# Patient Record
Sex: Female | Born: 1960 | Race: White | Hispanic: No | Marital: Married | Smoking: Never smoker
Health system: Southern US, Community
[De-identification: ages and names within clinical notes are randomized; demographics above are authoritative.]

## PROBLEM LIST (undated history)

## (undated) DIAGNOSIS — Z9289 Personal history of other medical treatment: Secondary | ICD-10-CM

## (undated) DIAGNOSIS — I10 Essential (primary) hypertension: Secondary | ICD-10-CM

## (undated) DIAGNOSIS — E119 Type 2 diabetes mellitus without complications: Secondary | ICD-10-CM

## (undated) DIAGNOSIS — E041 Nontoxic single thyroid nodule: Secondary | ICD-10-CM

## (undated) DIAGNOSIS — Z860101 Personal history of adenomatous and serrated colon polyps: Secondary | ICD-10-CM

## (undated) DIAGNOSIS — Z87442 Personal history of urinary calculi: Secondary | ICD-10-CM

## (undated) DIAGNOSIS — N201 Calculus of ureter: Secondary | ICD-10-CM

## (undated) DIAGNOSIS — F988 Other specified behavioral and emotional disorders with onset usually occurring in childhood and adolescence: Secondary | ICD-10-CM

## (undated) DIAGNOSIS — Z86010 Personal history of colonic polyps: Secondary | ICD-10-CM

## (undated) DIAGNOSIS — E785 Hyperlipidemia, unspecified: Secondary | ICD-10-CM

## (undated) DIAGNOSIS — M1990 Unspecified osteoarthritis, unspecified site: Secondary | ICD-10-CM

## (undated) DIAGNOSIS — Z973 Presence of spectacles and contact lenses: Secondary | ICD-10-CM

## (undated) DIAGNOSIS — N951 Menopausal and female climacteric states: Secondary | ICD-10-CM

## (undated) HISTORY — DX: Menopausal and female climacteric states: N95.1

## (undated) HISTORY — DX: Personal history of other medical treatment: Z92.89

## (undated) HISTORY — DX: Hyperlipidemia, unspecified: E78.5

## (undated) HISTORY — DX: Other specified behavioral and emotional disorders with onset usually occurring in childhood and adolescence: F98.8

## (undated) HISTORY — DX: Essential (primary) hypertension: I10

## (undated) HISTORY — PX: KNEE SURGERY: SHX244

## (undated) HISTORY — DX: Type 2 diabetes mellitus without complications: E11.9

---

## 2000-10-12 ENCOUNTER — Other Ambulatory Visit: Admission: RE | Admit: 2000-10-12 | Discharge: 2000-10-12 | Payer: Self-pay | Admitting: Obstetrics and Gynecology

## 2002-05-25 ENCOUNTER — Other Ambulatory Visit: Admission: RE | Admit: 2002-05-25 | Discharge: 2002-05-25 | Payer: Self-pay | Admitting: Obstetrics and Gynecology

## 2003-10-02 ENCOUNTER — Other Ambulatory Visit: Admission: RE | Admit: 2003-10-02 | Discharge: 2003-10-02 | Payer: Self-pay | Admitting: Obstetrics and Gynecology

## 2005-09-22 ENCOUNTER — Other Ambulatory Visit: Admission: RE | Admit: 2005-09-22 | Discharge: 2005-09-22 | Payer: Self-pay | Admitting: Obstetrics and Gynecology

## 2007-05-09 ENCOUNTER — Ambulatory Visit: Payer: Self-pay | Admitting: Cardiology

## 2010-04-11 ENCOUNTER — Ambulatory Visit (HOSPITAL_COMMUNITY): Admission: RE | Admit: 2010-04-11 | Discharge: 2010-04-11 | Payer: Self-pay | Admitting: Obstetrics and Gynecology

## 2010-04-11 HISTORY — PX: BLADDER SUSPENSION: SHX72

## 2010-12-06 LAB — SURGICAL PCR SCREEN
MRSA, PCR: NEGATIVE
Staphylococcus aureus: NEGATIVE

## 2010-12-06 LAB — CBC
Platelets: 310 10*3/uL (ref 150–400)
RBC: 4.36 MIL/uL (ref 3.87–5.11)
WBC: 5.6 10*3/uL (ref 4.0–10.5)

## 2011-02-03 NOTE — Assessment & Plan Note (Signed)
[facility]                            CARDIOLOGY OFFICE NOTE   NAME:Martin, Teresa C                           MRN:          [id number]  DATE:05/09/2007                            DOB:          04/15/1961    Teresa Martin is a delightful 50 year old married white female who is  referred by Dr. Richardean Chimera to consult on her family history of  premature coronary disease.  She also has a risk factor of obesity.   HISTORY OF PRESENT ILLNESS:  She is 50 years of age, married mother of 3  who is a high school Retail buyer.   Her father, who was hypertensive, and also a little bit over weight, and  sporadically exercised, died of a massive heart attack within a week of  playing tennis.  He was 22 years of age.  Her grandfather, her father's  father, died at 39 of a heart attack.   She has no conventional risk factors other than her weight, and somewhat  sedentary lifestyle.  She does walk her dog daily at a slow pace about  30 minutes a day.   Specifically, she does not smoke, does not have hypertension or  diabetes, does not use any illicit drugs, and her lipids are good.  I do  not have a copy of this, but she says that they were recently checked,  and were said to be good by Dr. Arelia Sneddon.   She is currently having no symptoms of ischemia or angina.   PAST MEDICAL HISTORY:  She is on:  1. Concerta 54 mg a day.  2. Lutine for her eyes.  3. Calcium.  4. Multivitamin.   SURGICAL HISTORY:  None.   FAMILY HISTORY:  As above.   SOCIAL HISTORY:  As mentioned above, she is a high school Advertising copywriter.  She is married, and has 3 children.   REVIEW OF SYSTEMS:  Negative, other than the HPI.   EXAMINATION:  Her blood pressure is 124/84.  Pulse 70 and regular.  Weight is 165.  She is 5 feet 6 inches.  HEENT:  Normocephalic and atraumatic.  PERRLA.  Extraocular movements  intact.  Sclerae are clear.  Facial symmetry is normal.  Carotids are full without  bruits.  There is no JVD.  Thyroid is not  enlarged.  Trachea is midline.  LUNGS:  Clear.  HEART:  Reveals a non-displaced PMI.  She has a normal S1 and S2 without  murmur or gallop.  ABDOMINAL EXAM:  Soft with good bowel sounds.  No midline bruit.  EXTREMITIES:  Reveal no cyanosis, clubbing, or edema.  Pulses are  intact.  NEUROLOGIC:  Exam is intact.  SKIN:  Unremarkable.   EKG:  Completely normal.   ASSESSMENT AND PLAN:  Ms. Cranmer has very low 10-year risk for coronary  artery disease or premature coronary event.  Her Framingham risk score  calculates in the 1% to 2% range.   I had a long talk with her today about therapeutic lifestyle changes  including brisk walking 3 hours per week, as  well as maintaining her  weight, or actually losing down to about 150 pounds.  This would put her  BMI in to the healthy zone.   I would not recommend any other measurements at the present time.  One  of her biggest risks is developing type 2 diabetes, as her brother has,  with increasing weight.  I made her well aware of the consequences of  this, and the above necessary measurements to avoid it.   We will see her back on a p.r.n. basis.  She was a delight to meet and  talk to.     Thomas C. Daleen Squibb, MD, [facility]  Electronically Signed    TCW/MedQ  DD: 05/09/2007  DT: 05/10/2007  Job #: [id number]   cc:   Juluis Mire, M.D.

## 2012-04-19 ENCOUNTER — Encounter: Payer: Self-pay | Admitting: *Deleted

## 2012-04-26 ENCOUNTER — Encounter: Payer: Self-pay | Admitting: Cardiology

## 2012-04-26 ENCOUNTER — Ambulatory Visit (INDEPENDENT_AMBULATORY_CARE_PROVIDER_SITE_OTHER): Payer: No Typology Code available for payment source | Admitting: Cardiology

## 2012-04-26 VITALS — BP 145/94 | HR 91 | Ht 60.0 in | Wt 189.6 lb

## 2012-04-26 DIAGNOSIS — Z9189 Other specified personal risk factors, not elsewhere classified: Secondary | ICD-10-CM

## 2012-04-26 DIAGNOSIS — E663 Overweight: Secondary | ICD-10-CM

## 2012-04-26 DIAGNOSIS — E7800 Pure hypercholesterolemia, unspecified: Secondary | ICD-10-CM

## 2012-04-26 DIAGNOSIS — I10 Essential (primary) hypertension: Secondary | ICD-10-CM

## 2012-04-26 DIAGNOSIS — Z789 Other specified health status: Secondary | ICD-10-CM

## 2012-04-26 MED ORDER — LISINOPRIL 10 MG PO TABS: 10.0000 mg | ORAL_TABLET | Freq: Every day | ORAL | Status: DC

## 2012-04-26 NOTE — Patient Instructions (Addendum)
Start lisinopril 10mg  daily.  Start aspirin 81mg  daily. Take this with food.  Your physician recommends that you return for fasting lab in about 10-14 days--BMET/Lp a.   Take and record your blood pressure daily. I will call you in about 3 weeks to get the readings. Luana Shu [phone].  Schedule an appointment for a calcium artery score CT scan.  Your physician wants you to follow-up in: 1 year with Dr Shirlee Latch. (August 2014). You will receive a reminder letter in the mail two months in advance. If you don't receive a letter, please call our office to schedule the follow-up appointment.    Your physician recommends that you return for a FASTING lipid profile: in 1 year when you see Dr Shirlee Latch.

## 2012-04-27 DIAGNOSIS — Z9189 Other specified personal risk factors, not elsewhere classified: Secondary | ICD-10-CM | POA: Insufficient documentation

## 2012-04-27 DIAGNOSIS — E663 Overweight: Secondary | ICD-10-CM | POA: Insufficient documentation

## 2012-04-27 DIAGNOSIS — I10 Essential (primary) hypertension: Secondary | ICD-10-CM | POA: Insufficient documentation

## 2012-04-27 NOTE — Assessment & Plan Note (Signed)
We discussed diet and exercise strategies for weight loss.

## 2012-04-27 NOTE — Assessment & Plan Note (Addendum)
Blood pressure has been consistently elevated.  I will have her start on lisinopril 10 mg daily with BMET in 2 wks to follow K and creatinine. She will get a BP cuff and check her BP on lisinopril.  We will call in 3 wks to see what her BP is running.  She does not screen positive for OSA.  Weight loss would certainly be helpful for BP control.

## 2012-04-27 NOTE — Assessment & Plan Note (Signed)
The Framingham risk score here is not particularly high (2% risk of cardiovascular event over the next 2 years).  However, this does not take into account family history.  Teresa Martin has a rather strong family history of coronary disease, both her father and grandfather died of MIs in their 57s.  I think that we should treat her HTN, as above, with lisinopril.  I think that it would be reasonable, given the family history, to have her take ASA 81 mg daily.  We also need to consider treatment of her cholesterol, given LDL 151.  We talked at length about this.  I will further risk stratify her with a lipoprotein(a) and with a coronary artery calcium score.  If either are abnormal, I think she should take a statin.  If both are normal, a period of lifestyle modification would be appropriate.

## 2012-04-27 NOTE — Progress Notes (Signed)
Patient ID: Teresa Martin, female   DOB: [birth year], 51 y.o.   MRN: [id number] Referring MD: Dr. Arelia Sneddon  51 yo with history of HTN and hyperlipidemia presents for evaluation of cardiovascular risk factors.  She is concerned about her risk for coronary disease as her father died of a massive MI at 41 and her grandfather died of a MI at 30.  She does exercise => primarily walking.  No exertional dyspnea, good exercise tolerance. No history of chest pain or pressure.  She has had high blood pressure readings for 2 years.  She is 145/94 today and brings in several recent readings: 160/100, 150/92.  She also has elevated LDL cholesterol, 151 when last checked.  She does not have OSA symptoms (loud snoring, daytime sleepiness, etc).  She does not follow a specific diet though she tries to limit fats.  She does not smoke.    ECG: NSR, normal  Labs (6/13): LDL 151, HDL 55, TSH normal  PMH: 1. Attention deficit disorder 2. HTN 3. Hyperlipidemia  SH: Nonsmoker.  Married, 3 children.  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   FH: Father died of large MI at 35.  Grandfather died of large MI at 31.   ROS: All systems reviewed and negative except as per HPI.   Current Outpatient Prescriptions  Medication Sig Dispense Refill  . methylphenidate (CONCERTA) 27 MG CR tablet Take 27 mg by mouth as directed.       Marland Kitchen aspirin EC 81 MG tablet Take 1 tablet (81 mg total) by mouth daily.      Marland Kitchen lisinopril (PRINIVIL,ZESTRIL) 10 MG tablet Take 1 tablet (10 mg total) by mouth daily.  30 tablet  11    BP 145/94  Pulse 91  Ht 5' (1.524 m)  Wt 189 lb 9.6 oz (86.002 kg)  BMI 37.03 kg/m2 General: NAD, overweight Neck: No JVD, no thyromegaly or thyroid nodule.  Lungs: Clear to auscultation bilaterally with normal respiratory effort. CV: Nondisplaced PMI.  Heart regular S1/S2, no S3/S4, no murmur.  No peripheral edema.  No carotid bruit.  Normal pedal pulses.  Abdomen: Soft, nontender, no  hepatosplenomegaly, no distention.  Skin: Intact without lesions or rashes.  Neurologic: Alert and oriented x 3.  Psych: Normal affect. Extremities: No clubbing or cyanosis.  HEENT: Normal.

## 2012-05-10 ENCOUNTER — Other Ambulatory Visit (INDEPENDENT_AMBULATORY_CARE_PROVIDER_SITE_OTHER): Payer: No Typology Code available for payment source

## 2012-05-10 ENCOUNTER — Ambulatory Visit (INDEPENDENT_AMBULATORY_CARE_PROVIDER_SITE_OTHER)
Admission: RE | Admit: 2012-05-10 | Discharge: 2012-05-10 | Disposition: A | Discharge status: Home / Self Care | Payer: Self-pay | Source: Ambulatory Visit | Attending: Cardiology | Admitting: Cardiology

## 2012-05-10 DIAGNOSIS — I10 Essential (primary) hypertension: Secondary | ICD-10-CM

## 2012-05-10 DIAGNOSIS — E7800 Pure hypercholesterolemia, unspecified: Secondary | ICD-10-CM

## 2012-05-10 DIAGNOSIS — Z9189 Other specified personal risk factors, not elsewhere classified: Secondary | ICD-10-CM

## 2012-05-10 DIAGNOSIS — Z789 Other specified health status: Secondary | ICD-10-CM

## 2012-05-10 LAB — BASIC METABOLIC PANEL
CO2: 27 mEq/L (ref 19–32)
Calcium: 9.1 mg/dL (ref 8.4–10.5)
Chloride: 103 mEq/L (ref 96–112)
Sodium: 136 mEq/L (ref 135–145)

## 2012-05-13 ENCOUNTER — Telehealth: Payer: Self-pay | Admitting: *Deleted

## 2012-05-13 NOTE — Telephone Encounter (Signed)
Hypertension - Marca Ancona, MD 04/27/2012 1:50 PM Addendum  Blood pressure has been consistently elevated. I will have her start on lisinopril 10 mg daily with BMET in 2 wks to follow K and creatinine. She will get a BP cuff and check her BP on lisinopril. We will call in 3 wks to see what her BP is running. She does not screen positive for OSA. Weight loss would certainly be helpful for BP control.  05/13/12--spoke with pt. Pt did not have BP readings with her but states her BP has been better- mostly in the 120/80 range. I will forward to Dr Shirlee Latch for review

## 2012-05-15 NOTE — Telephone Encounter (Signed)
Ok no changes

## 2012-05-16 NOTE — Telephone Encounter (Signed)
LMTCB

## 2012-05-18 NOTE — Telephone Encounter (Signed)
Spoke with pt

## 2013-05-03 ENCOUNTER — Other Ambulatory Visit: Payer: Self-pay | Admitting: Cardiology

## 2013-05-05 ENCOUNTER — Encounter: Payer: Self-pay | Admitting: *Deleted

## 2013-05-09 ENCOUNTER — Encounter: Payer: Self-pay | Admitting: Cardiology

## 2013-05-09 ENCOUNTER — Ambulatory Visit (INDEPENDENT_AMBULATORY_CARE_PROVIDER_SITE_OTHER): Payer: No Typology Code available for payment source | Admitting: Cardiology

## 2013-05-09 VITALS — BP 122/80 | HR 81 | Ht 60.0 in | Wt 187.0 lb

## 2013-05-09 DIAGNOSIS — Z9189 Other specified personal risk factors, not elsewhere classified: Secondary | ICD-10-CM

## 2013-05-09 DIAGNOSIS — I10 Essential (primary) hypertension: Secondary | ICD-10-CM

## 2013-05-09 DIAGNOSIS — E785 Hyperlipidemia, unspecified: Secondary | ICD-10-CM

## 2013-05-09 MED ORDER — ATORVASTATIN CALCIUM 10 MG PO TABS: 10.0000 mg | ORAL_TABLET | Freq: Every day | ORAL | Status: DC

## 2013-05-09 MED ORDER — ROSUVASTATIN CALCIUM 5 MG PO TABS: 5.0000 mg | ORAL_TABLET | Freq: Every day | ORAL | Status: DC

## 2013-05-09 MED ORDER — LISINOPRIL 20 MG PO TABS: 20.0000 mg | ORAL_TABLET | Freq: Every day | ORAL | Status: DC

## 2013-05-09 NOTE — Patient Instructions (Addendum)
**Note De-Identified Luis Sami Obfuscation** Your physician has recommended you make the following change in your medication: start taking Atorvastatin 10 mg at bedtime and increase Lisinopril to 20 mg daily  Your physician recommends that you return for lab work in: today and in 2 months (10/13)  Your physician wants you to follow-up in: 1 year. You will receive a reminder letter in the mail two months in advance. If you don't receive a letter, please call our office to schedule the follow-up appointment.

## 2013-05-10 DIAGNOSIS — E785 Hyperlipidemia, unspecified: Secondary | ICD-10-CM | POA: Insufficient documentation

## 2013-05-10 LAB — BASIC METABOLIC PANEL
CO2: 27 mEq/L (ref 19–32)
GFR: 94.81 mL/min (ref >60.00)
Glucose, Bld: 159 mg/dL — ABNORMAL HIGH (ref 70–99)
Potassium: 3.6 mEq/L (ref 3.5–5.1)
Sodium: 136 mEq/L (ref 135–145)

## 2013-05-10 LAB — LIPID PANEL
HDL: 55.6 mg/dL (ref >39.00)
VLDL: 22 mg/dL (ref 0.0–40.0)

## 2013-05-10 NOTE — Progress Notes (Signed)
Patient ID: Teresa Martin, female   DOB: Dec 17, 1960, 52 y.o.   MRN: [id number] Referring MD: Dr. Arelia Sneddon  52 yo with history of HTN and hyperlipidemia returns for evaluation of cardiovascular risk factors.  She is concerned about her risk for coronary disease as her father died of a massive MI at 86 and her grandfather died of a MI at 49.  She does exercise => primarily walking.  No exertional dyspnea, good exercise tolerance. No history of chest pain or pressure.  BP remains high on lisinopril 10 mg daily (typically SBP > 140).  I had suggested starting a statin primarily given family history and elevated Lp(a) but she wanted to hold off.   Labs (6/13): LDL 151, HDL 55, TSH normal Labs (8/13): Lp(a) 70  PMH: 1. Attention deficit disorder 2. HTN 3. Hyperlipidemia 4. Coronary artery calcium score 8/13: 0 Agatston units  SH: Nonsmoker.  Married, 3 children.  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   FH: Father died of large MI at 44.  Grandfather died of large MI at 24.   ROS: All systems reviewed and negative except as per HPI.   Current Outpatient Prescriptions  Medication Sig Dispense Refill  . aspirin EC 81 MG tablet Take 1 tablet (81 mg total) by mouth daily.      Marland Kitchen lisinopril (PRINIVIL,ZESTRIL) 20 MG tablet Take 1 tablet (20 mg total) by mouth daily.  90 tablet  3  . methylphenidate (CONCERTA) 27 MG CR tablet Take 27 mg by mouth as directed.       Marland Kitchen atorvastatin (LIPITOR) 10 MG tablet Take 1 tablet (10 mg total) by mouth daily.  90 tablet  3   No current facility-administered medications for this visit.    BP 122/80  Pulse 81  Ht 5' (1.524 m)  Wt 84.823 kg (187 lb)  BMI 36.52 kg/m2  SpO2 97% General: NAD, overweight Neck: No JVD, no thyromegaly or thyroid nodule.  Lungs: Clear to auscultation bilaterally with normal respiratory effort. CV: Nondisplaced PMI.  Heart regular S1/S2, no S3/S4, no murmur.  No peripheral edema.  No carotid bruit.  Normal pedal pulses.   Abdomen: Soft, nontender, no hepatosplenomegaly, no distention.  Skin: Intact without lesions or rashes.  Neurologic: Alert and oriented x 3.  Psych: Normal affect. Extremities: No clubbing or cyanosis.   Assessment/Plan: 1. HTN: BP remains high at home.  I am going to increase lisinopril to 20 mg daily with followup BMET. Next step will be to add HCTZ.  Continue to exercise and work at weight loss.  Call in 2 wks for BP check.  2. 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  However, coronary artery calcium score was 0.  Given her strong family history of premature CAD, I continue to think that a statin would be reasonable for her.  We discussed it today and she will try Crestor 5 mg daily with lipids/LFTs in 2 months.   Marca Ancona 05/10/2013

## 2013-05-16 ENCOUNTER — Other Ambulatory Visit: Payer: Self-pay | Admitting: *Deleted

## 2013-05-26 ENCOUNTER — Telehealth: Payer: Self-pay | Admitting: *Deleted

## 2013-05-26 NOTE — Telephone Encounter (Signed)
05/09/13 HTN: BP remains high at home. I am going to increase lisinopril to 20 mg daily with followup BMET. Next step will be to add HCTZ. Continue to exercise and work at weight loss. Call in 2 wks for BP check.   05/26/13 Pt states she did not have log of BP readings with her today, she did say BP was better since increasing lisinopril. She will call back the first of the week with the readings.

## 2013-05-30 NOTE — Telephone Encounter (Signed)
LMTCB

## 2013-06-28 ENCOUNTER — Ambulatory Visit: Payer: No Typology Code available for payment source | Admitting: Cardiology

## 2013-06-29 NOTE — Telephone Encounter (Signed)
I have been unable to reach patient by telephone.

## 2014-05-22 ENCOUNTER — Other Ambulatory Visit: Payer: Self-pay | Admitting: *Deleted

## 2014-05-22 MED ORDER — ATORVASTATIN CALCIUM 10 MG PO TABS: 10.0000 mg | ORAL_TABLET | Freq: Every day | ORAL | Status: DC

## 2014-07-19 ENCOUNTER — Other Ambulatory Visit: Payer: Self-pay | Admitting: Cardiology

## 2014-08-29 ENCOUNTER — Other Ambulatory Visit: Payer: Self-pay | Admitting: Cardiology

## 2014-09-05 ENCOUNTER — Ambulatory Visit: Payer: Self-pay | Admitting: Cardiology

## 2014-09-20 ENCOUNTER — Encounter: Payer: Self-pay | Admitting: *Deleted

## 2014-09-20 ENCOUNTER — Encounter: Payer: BC Managed Care – PPO | Attending: Endocrinology | Admitting: *Deleted

## 2014-09-20 VITALS — Ht 65.5 in | Wt 188.1 lb

## 2014-09-20 DIAGNOSIS — E119 Type 2 diabetes mellitus without complications: Secondary | ICD-10-CM

## 2014-09-20 DIAGNOSIS — Z713 Dietary counseling and surveillance: Secondary | ICD-10-CM | POA: Diagnosis not present

## 2014-09-20 NOTE — Progress Notes (Signed)
Diabetes Self-Management Education   Appt. Start Time: 260830t. End Time: 1030  09/20/2014  [name], identified by name and date of birth, is a 53 y.o. female with a diagnosis of Diabetes: Type 2.  Other people present during visit:  Patient . Patient presents with new diagnosis of T2DM. In review of her lab results I note that her glucose has been elevated for greater than years. Patient indicated that she was unaware of the elevation.  ASSESSMENT  Height 5' 5.5" (1.664 m), weight 188 lb 1.6 oz (85.322 kg). Body mass index is 30.81 kg/(m^2).  Initial Visit Information:  Are you currently following a meal plan?: No Are you taking your medications as prescribed?: Yes Are you checking your feet?: No How often do you need to have someone help you when you read instructions, pamphlets, or other written materials from your doctor or pharmacy?: 1 - Never  Psychosocial:   Patient Belief/Attitude about Diabetes: Motivated to manage diabetes Self-care barriers: None Self-management support: Doctor's office, Family, CDE visits Other persons present: Patient Patient Concerns: Nutrition/Meal planning, Medication, Monitoring, Glycemic Control Special Needs: None Preferred Learning Style: No preference indicated Learning Readiness: Ready  Complications:   How often do you check your blood sugar?: 0 times/day (not testing) (will begin testing FBS & 2hpp dinner) Have you had a dilated eye exam in the past 12 months?: No Have you had a dental exam in the past 12 months?: No  Exercise:  Exercise: Moderate (swimming / aerobic walking) Moderate Exercise amount of time (min / week): 150  Individualized Plan for Diabetes Self-Management Training:   Learning Objective:  Patient will have a greater understanding of diabetes self-management. Patient education plan per assessed needs and concerns is to attend individual sessions      Education Topics Reviewed with Patient Today:  Factors  that contribute to the development of diabetes, Explored patient's options for treatment of their diabetes Role of diet in the treatment of diabetes and the relationship between the three main macronutrients and blood glucose level, Food label reading, portion sizes and measuring food., Carbohydrate counting, Reviewed blood glucose goals for pre and post meals and how to evaluate the patients' food intake on their blood glucose level., Effects of alcohol on blood glucose and safety factors with consumption of alcohol., Meal options for control of blood glucose level and chronic complications. Role of exercise on diabetes management, blood pressure control and cardiac health. Taught/evaluated SMBG meter., Purpose and frequency of SMBG., Daily foot exams, Yearly dilated eye exam, Identified appropriate SMBG and/or A1C goals. Relationship between chronic complications and blood glucose control, Assessed and discussed foot care and prevention of foot problems, Lipid levels, blood glucose control and heart disease, Identified and discussed with patient  current chronic complications, Nephropathy, what it is, prevention of, the use of ACE, ARB's and early detection of through urine microalbumia., Reviewed with patient heart disease, higher risk of, and prevention   PATIENTS GOALS/Plan (Developed by the patient):  Nutrition: General guidelines for healthy choices and portions discussed Physical Activity: Exercise 3-5 times per week Medications: take my medication as prescribed Monitoring : test blood glucose pre and post meals as discussed Reducing Risk: get labs drawn, do foot checks daily  Patient Instructions  Plan:  Aim for 3 Carb Choices per meal (45 grams) +/- 1 either way  Aim for 0-15 Carbs per snack if hungry  Include protein in moderation with your meals and snacks Consider reading food labels for Total Carbohydrate and Fat  Grams of foods Continue  your activity level daily as  tolerated Consider checking BG at alternate times per day as directed by MD  Consider taking medication as directed by MD  Glucose targets:  Fasting: 70-130          2 hours after meal <180  Today result 2 1/2hours pp 233mg /dl  Expected Outcomes:  Demonstrated interest in learning. Expect positive outcomes  Education material provided: Living Well with Diabetes, A1C conversion sheet, Meal plan card, My Plate and Snack sheet  If problems or questions, patient to contact team via:  Phone  Future DSME appointment: 4-6 wks

## 2014-09-20 NOTE — Patient Instructions (Signed)
Plan:  Aim for 3 Carb Choices per meal (45 grams) +/- 1 either way  Aim for 0-15 Carbs per snack if hungry  Include protein in moderation with your meals and snacks Consider reading food labels for Total Carbohydrate and Fat Grams of foods Continue your activity level daily as tolerated Consider checking BG at alternate times per day to include FBS and 2hpp largest meal of the day as directed by MD  Consider taking medication as directed by MD  Fasting: 70-130 2 hours after meal <180  Today result 2 1/2hours pp 233mg /dl  Follow dietary recommendations, test glucose BID, Call Dr. Talmage NapBalan upon return from your trip and report glucose readings.

## 2014-10-03 ENCOUNTER — Other Ambulatory Visit: Payer: Self-pay | Admitting: Cardiology

## 2014-10-04 ENCOUNTER — Encounter: Payer: Self-pay | Admitting: Cardiology

## 2014-10-04 ENCOUNTER — Ambulatory Visit (INDEPENDENT_AMBULATORY_CARE_PROVIDER_SITE_OTHER): Payer: Commercial Managed Care - PPO | Admitting: Cardiology

## 2014-10-04 VITALS — BP 140/82 | HR 63 | Ht 65.5 in | Wt 199.4 lb

## 2014-10-04 DIAGNOSIS — E785 Hyperlipidemia, unspecified: Secondary | ICD-10-CM

## 2014-10-04 DIAGNOSIS — I10 Essential (primary) hypertension: Secondary | ICD-10-CM

## 2014-10-04 MED ORDER — LISINOPRIL 20 MG PO TABS: 20.0000 mg | ORAL_TABLET | Freq: Every day | ORAL | Status: DC

## 2014-10-04 MED ORDER — HYDROCHLOROTHIAZIDE 25 MG PO TABS: 25.0000 mg | ORAL_TABLET | Freq: Every day | ORAL | Status: DC

## 2014-10-04 MED ORDER — POTASSIUM CHLORIDE ER 10 MEQ PO TBCR: 10.0000 meq | EXTENDED_RELEASE_TABLET | Freq: Every day | ORAL | Status: DC

## 2014-10-04 MED ORDER — ATORVASTATIN CALCIUM 10 MG PO TABS: 10.0000 mg | ORAL_TABLET | Freq: Every day | ORAL | Status: DC

## 2014-10-04 NOTE — Patient Instructions (Signed)
Start HCTZ (hydrochlorothiazide) 25mg  daily.  Start KCL (potassium) 10 mEq daily.  Your physician recommends that you return for lab work in: about 10 days--BMET.  Your physician has requested that you regularly monitor and record your blood pressure readings at home. Please use the same machine at the same time of day to check your readings and record them to bring. I will call you in 2-3 weeks to get the readings.   Your physician wants you to follow-up in: 6 monhts with Dr Shirlee LatchMcLean. (July 2016). You will receive a reminder letter in the mail two months in advance. If you don't receive a letter, please call our office to schedule the follow-up appointment.

## 2014-10-04 NOTE — Progress Notes (Signed)
Patient ID: Teresa Martin, female   DOB: Apr 12, 1961, 54 y.o.   MRN: [id number] Referring MD: Dr. Arelia Sneddon  54 yo with history of HTN, hyperlipidemia, and newly diagnosed type II diabetes returns for evaluation of cardiovascular risk factors.  She is concerned about her risk for coronary disease as her father died of a massive MI at 8 and her grandfather died of a MI at 63.  She does exercise => primarily walking.  No exertional dyspnea, good exercise tolerance. No history of chest pain or pressure.  At last visit, I increased her lisinopril but BP remains elevated today and on home readings.  Recent lipids show good LDL and HDL.  She was diagnosed with diabetes recently, blood glucose is running quite high.  She will be seeing Dr. Talmage Nap to get on meds.  She snores mildly at night but denies daytime sleepiness.   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 Attention deficit disorder 2. HTN 3. Hyperlipidemia 4. Coronary artery calcium score 8/13: 0 Agatston units 5. Type II diabetes  SH: Nonsmoker.  Married, 3 children.  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   FH: Father died of large MI at 62.  Grandfather died of large MI at 41.   ROS: All systems reviewed and negative except as per HPI.   Current Outpatient Prescriptions  Medication Sig Dispense Refill  . aspirin EC 81 MG tablet Take 1 tablet (81 mg total) by mouth daily.    Marland Kitchen atorvastatin (LIPITOR) 10 MG tablet Take 1 tablet (10 mg total) by mouth daily. 90 tablet 1  . lisinopril (PRINIVIL,ZESTRIL) 20 MG tablet Take 1 tablet (20 mg total) by mouth daily. 90 tablet 1  . methylphenidate (CONCERTA) 27 MG CR tablet Take 27 mg by mouth daily as needed. By mouth daily as needed for concentration    . ONETOUCH DELICA LANCETS 33G MISC   0  . ONETOUCH VERIO test strip    0  . hydrochlorothiazide (HYDRODIURIL) 25 MG tablet Take 1 tablet (25 mg total) by mouth daily. 90 tablet 1  . potassium chloride (K-DUR) 10 MEQ tablet Take 1 tablet (10 mEq total) by mouth daily. 90 tablet 1   No current facility-administered medications for this visit.    BP 140/82 mmHg  Pulse 63  Ht 5' 5.5" (1.664 m)  Wt 199 lb 6.4 oz (90.447 kg)  BMI 32.67 kg/m2 General: NAD, overweight Neck: No JVD, no thyromegaly or thyroid nodule.  Lungs: Clear to auscultation bilaterally with normal respiratory effort. CV: Nondisplaced PMI.  Heart regular S1/S2, no S3/S4, no murmur.  No peripheral edema.  No carotid bruit.  Normal pedal pulses.  Abdomen: Soft, nontender, no hepatosplenomegaly, no distention.  Skin: Intact without lesions or rashes.  Neurologic: Alert and oriented x 3.  Psych: Normal affect. Extremities: No clubbing or cyanosis.   Assessment/Plan: 1. HTN: BP remains high at home. Continue lisinopril, I will add HCTZ 25 mg daily with KCl 10 daily.  She will need BMET in 2 wks.  We will call her for BP readings in about 2 wks.  Weight loss would help BP control.  She does not screen positive for OSA.  2. Hyperlipidemia: LDL much improved on atorvastatin.  Continue current statin dose.  3. Diabetes: No meds currently but blood  glucose has been very high on her home readings.  She will need medication and will call her endocrinologist, Dr. Talmage NapBalan.   Marca AnconaDalton Melford Tullier 10/04/2014

## 2014-10-05 NOTE — Addendum Note (Signed)
Addended by: Jacqlyn KraussLANKFORD, Orlen Leedy M on: 10/05/2014 07:25 AM   Modules accepted: Orders

## 2014-10-18 ENCOUNTER — Ambulatory Visit: Payer: BC Managed Care – PPO | Admitting: *Deleted

## 2014-10-18 ENCOUNTER — Other Ambulatory Visit: Payer: Commercial Managed Care - PPO

## 2014-10-18 ENCOUNTER — Encounter: Payer: Self-pay | Admitting: Cardiology

## 2014-10-19 ENCOUNTER — Ambulatory Visit: Payer: BC Managed Care – PPO | Admitting: *Deleted

## 2014-10-22 ENCOUNTER — Other Ambulatory Visit: Payer: Commercial Managed Care - PPO

## 2014-10-23 ENCOUNTER — Other Ambulatory Visit: Payer: Commercial Managed Care - PPO

## 2014-10-31 ENCOUNTER — Encounter: Payer: Commercial Managed Care - PPO | Attending: Endocrinology | Admitting: *Deleted

## 2014-10-31 VITALS — Wt 182.1 lb

## 2014-10-31 DIAGNOSIS — Z713 Dietary counseling and surveillance: Secondary | ICD-10-CM | POA: Insufficient documentation

## 2014-10-31 DIAGNOSIS — E119 Type 2 diabetes mellitus without complications: Secondary | ICD-10-CM | POA: Insufficient documentation

## 2014-11-01 ENCOUNTER — Encounter: Payer: Self-pay | Admitting: *Deleted

## 2014-11-05 NOTE — Progress Notes (Signed)
Diabetes Self-Management Education  Visit Type:   Follow-up  Appt. Start Time: 0800 Appt. End Time: 0930  11/05/2014  [name], identified by name and date of birth, is a 54 y.o. female with a diagnosis of Diabetes: Type 2.  Other people present during visit:  Patient . Patient had many questions related the nutrition. I had Teresa BraunLeslie Martin, RD, LCN come join in our session to address specific questions. I suggested that [name] return for follow up with Teresa Martin for further nutrition consultation related to her diabetes.  ASSESSMENT  Weight 182 lb 1.6 oz (82.6 kg). Body mass index is 29.83 kg/(m^2).   Subsequent Visit Information:  Since your last visit, have you continued or began the use of a meal plan?: Yes How many days a week are you following a meal plan?: 7 Since your last visit, have you continued or began to exercise on a consistent basis?: Yes How many days per week are you exercising or participating in a physicial activity for more than 20 minutes?: 6 Since your last visit have you continued or begun to take your medications as prescribed?: Yes (just started Metformin XR 500mg ) Since your last visit have you experienced any weight changes?: Loss Weight Loss (lbs): 17 Since your last visit, are you checking your blood glucose at least once a day?: Yes  Psychosocial:   Patient Belief/Attitude about Diabetes: Motivated to manage diabetes Self-care barriers: None Self-management support: Doctor's office, CDE visits Other persons present: Patient Patient Concerns: Nutrition/Meal planning, Glycemic Control, Weight Control Special Needs: None Preferred Learning Style: No preference indicated Learning Readiness: Change in progress  Complications:   Last HgB A1C per patient/outside source: 10 mg/dL (increase from 1.6%9.4% on 09/11/2014) How often do you check your blood sugar?: 1-2 times/day  Exercise:  Exercise: Moderate (swimming / aerobic walking) Moderate Exercise  amount of time (min / week): 135  Individualized Plan for Diabetes Self-Management Training:   Learning Objective:  Patient will have a greater understanding of diabetes self-management. Patient education plan per assessed needs and concerns is to attend individual sessions    Education Topics Reviewed with Patient Today:   Information on hints to eating out and maintain blood glucose control. Reviewed patients medication for diabetes, action, purpose, timing of dose and side effects.   PATIENTS GOALS/Plan (Developed by the patient):  Physical Activity: Exercise 5-7 days per week Medications: take my medication as prescribed  Patient Self Evaluation of Goals - Patient rates self as meeting previously set goals:   Nutrition: >75% Physical Activity: >75% Medications: >75% Monitoring: >75% Problem Solving: >75% Reducing Risk: >75% Health Coping: >75%  Plan:   There are no Patient Instructions on file for this visit.   Expected Outcomes:  Demonstrated interest in learning. Expect positive outcomes  If problems or questions, patient to contact team via:  Phone  Future DSME appointment: - 2 months with Dietitian

## 2015-03-07 ENCOUNTER — Other Ambulatory Visit (INDEPENDENT_AMBULATORY_CARE_PROVIDER_SITE_OTHER): Payer: Commercial Managed Care - PPO | Admitting: *Deleted

## 2015-03-07 DIAGNOSIS — I10 Essential (primary) hypertension: Secondary | ICD-10-CM | POA: Diagnosis not present

## 2015-03-08 LAB — BASIC METABOLIC PANEL
BUN: 19 mg/dL (ref 6–23)
CHLORIDE: 100 meq/L (ref 96–112)
CO2: 27 meq/L (ref 19–32)
Calcium: 9.6 mg/dL (ref 8.4–10.5)
Creatinine, Ser: 0.8 mg/dL (ref 0.40–1.20)
GFR: 79.37 mL/min (ref >60.00)
GLUCOSE: 182 mg/dL — AB (ref 70–99)
POTASSIUM: 3.8 meq/L (ref 3.5–5.1)
SODIUM: 134 meq/L — AB (ref 135–145)

## 2015-03-11 ENCOUNTER — Other Ambulatory Visit: Payer: Self-pay | Admitting: Cardiology

## 2015-04-11 ENCOUNTER — Other Ambulatory Visit: Payer: Self-pay | Admitting: Cardiology

## 2015-04-16 NOTE — Progress Notes (Signed)
Cardiology Office Note   Date:  04/17/2015   ID:  Teresa Martin, DOB 1961/07/27, MRN [id number]  PCP:  Juluis Mire, MD  Cardiologist:  Dr. Marca Ancona   Electrophysiologist:  n/a  Chief Complaint  Patient presents with  . Follow-up    HTN, HL     History of Present Illness: Teresa Martin is a 54 y.o. female with a hx of HTN, hyperlipidemia, type II diabetes.  She has a strong FHx of CAD.  Her father died of a massive MI at 55 and her grandfather died of a MI at 57. She does exercise => primarily walking. Last seen by Dr. Marca Ancona 09/2014.  She returns for FU.  She is here alone.  The patient denies chest pain, shortness of breath, syncope, orthopnea, PND or significant pedal edema.  She has been dieting and exercising.  She has lost 32 lbs.    Studies/Reports Reviewed Today:  CT Cardiac Scoring 04/2012 IMPRESSION: Coronary artery calcium score of 0 Agatston units places the patient in a low risk category for future cardiovascular events.   Past Medical History  Diagnosis Date  . ADD (attention deficit disorder)   . Hypertension   . Perimenopausal   . Hyperlipidemia   . DM2 (diabetes mellitus, type 2)   . History of CT scan     Coronary artery calcium score 8/13: 0 Agatston units    Past Surgical History  Procedure Laterality Date  . Knee surgery      Right Knee     Current Outpatient Prescriptions  Medication Sig Dispense Refill  . aspirin EC 81 MG tablet Take 1 tablet (81 mg total) by mouth daily.    Marland Kitchen atorvastatin (LIPITOR) 10 MG tablet TAKE ONE TABLET BY MOUTH ONE TIME DAILY 90 tablet 1  . hydrochlorothiazide (HYDRODIURIL) 25 MG tablet TAKE ONE TABLET BY MOUTH ONE TIME DAILY 90 tablet 1  . lisinopril (PRINIVIL,ZESTRIL) 20 MG tablet TAKE ONE TABLET BY MOUTH ONE TIME DAILY 90 tablet 1  . metFORMIN (GLUCOPHAGE-XR) 500 MG 24 hr tablet Take 500 mg by mouth daily with breakfast. Take 2 in the a.m and 1 in the evening.    . methylphenidate (CONCERTA) 27 MG CR  tablet Take 27 mg by mouth daily as needed. By mouth daily as needed for concentration    . ONETOUCH DELICA LANCETS 33G MISC   0  . ONETOUCH VERIO test strip   0  . potassium chloride (K-DUR) 10 MEQ tablet Take 1 tablet (10 mEq total) by mouth daily. 90 tablet 1   No current facility-administered medications for this visit.    Allergies:   Review of patient's allergies indicates no known allergies.    Social History:  The patient  reports that she has never smoked. She has never used smokeless tobacco. She reports that she does not drink alcohol or use illicit drugs.   Family History:  The patient's family history includes Coronary artery disease in an other family member; Diabetes in her brother; Heart attack (age of onset: 46) in her paternal grandfather; Heart attack (age of onset: 74) in her father; Hypertension in her father.    ROS:   Please see the history of present illness.   Review of Systems  All other systems reviewed and are negative.     PHYSICAL EXAM: VS:  BP 120/80 mmHg  Pulse 82  Ht 5\' 6"  (1.676 m)  Wt 167 lb (75.751 kg)  BMI 26.97 kg/m2  Wt Readings from Last 3 Encounters:  04/17/15 167 lb (75.751 kg)  11/01/14 182 lb 1.6 oz (82.6 kg)  10/04/14 199 lb 6.4 oz (90.447 kg)     GEN: Well nourished, well developed, in no acute distress HEENT: normal Neck: no JVD, no carotid bruits, no masses Cardiac:  Normal S1/S2, RRR; no murmur ,  no rubs or gallops, no edema   Respiratory:  clear to auscultation bilaterally, no wheezing, rhonchi or rales. GI: soft, nontender, nondistended, + BS MS: no deformity or atrophy Skin: warm and dry  Neuro:  CNs II-XII intact, Strength and sensation are intact Psych: Normal affect   EKG:  EKG is ordered today.  It demonstrates:   NSR, HR 82, normal axis, no ST changes   Recent Labs: 03/07/2015: BUN 19; Creatinine, Ser 0.80; Potassium 3.8; Sodium 134*    Lipid Panel    Component Value Date/Time   CHOL 178 05/09/2013  1633   TRIG 110.0 05/09/2013 1633   HDL 55.60 05/09/2013 1633   CHOLHDL 3 05/09/2013 1633   VLDL 22.0 05/09/2013 1633   LDLCALC 100* 05/09/2013 1633      ASSESSMENT AND PLAN:  Essential hypertension:  Well controlled.  Continue current Rx. Recent K+ and Creatinine ok on labs last month.  Continue diet and exercise.  We discussed the possibility of reducing some medications if she continues to lose weight.  She had many questions regarding HTN and primary prevention of CV disease.    Hyperlipidemia:  Managed by Gyn.  LDL 75 in 06/2014.  Continue statin.  Diabetes:  FU with endocrine.    Medication Changes: Current medicines are reviewed at length with the patient today.  Concerns regarding medicines are as outlined above.  The following changes have been made:   Discontinued Medications   POTASSIUM CHLORIDE (K-DUR,KLOR-CON) 10 MEQ TABLET    TAKE ONE TABLET BY MOUTH ONE TIME DAILY   Modified Medications   No medications on file   New Prescriptions   No medications on file     Labs/ tests ordered today include:   Orders Placed This Encounter  Procedures  . EKG 12-Lead     Disposition:   FU with Dr. Marca Ancona 1 year.    Signed, Brynda Rim, MHS 04/17/2015 5:08 PM    [facility] [address] [place], [place], [place]  [address] Phone: [phone]; Fax: [phone]

## 2015-04-17 ENCOUNTER — Encounter: Payer: Self-pay | Admitting: Physician Assistant

## 2015-04-17 ENCOUNTER — Ambulatory Visit (INDEPENDENT_AMBULATORY_CARE_PROVIDER_SITE_OTHER): Payer: Commercial Managed Care - PPO | Admitting: Physician Assistant

## 2015-04-17 VITALS — BP 120/80 | HR 82 | Ht 66.0 in | Wt 167.0 lb

## 2015-04-17 DIAGNOSIS — E785 Hyperlipidemia, unspecified: Secondary | ICD-10-CM | POA: Diagnosis not present

## 2015-04-17 DIAGNOSIS — I10 Essential (primary) hypertension: Secondary | ICD-10-CM

## 2015-04-17 NOTE — Patient Instructions (Signed)
Medication Instructions:  Your physician recommends that you continue on your current medications as directed. Please refer to the Current Medication list given to you today.   Labwork: NONE  Testing/Procedures: NONE  Follow-Up: Your physician wants you to follow-up in: 1 YEAR WITH DR. MCLEAN. You will receive a reminder letter in the mail two months in advance. If you don't receive a letter, please call our office to schedule the follow-up appointment.   Any Other Special Instructions Will Be Listed Below (If Applicable).   

## 2015-09-03 ENCOUNTER — Other Ambulatory Visit: Payer: Self-pay | Admitting: Cardiology

## 2015-09-03 NOTE — Telephone Encounter (Signed)
Rx(s) sent to pharmacy electronically.  

## 2015-09-05 ENCOUNTER — Other Ambulatory Visit: Payer: Self-pay | Admitting: Cardiology

## 2015-09-19 ENCOUNTER — Other Ambulatory Visit: Payer: Self-pay | Admitting: Obstetrics and Gynecology

## 2015-09-19 DIAGNOSIS — E041 Nontoxic single thyroid nodule: Secondary | ICD-10-CM

## 2015-09-26 ENCOUNTER — Ambulatory Visit
Admission: RE | Admit: 2015-09-26 | Discharge: 2015-09-26 | Disposition: A | Discharge status: Home / Self Care | Payer: Commercial Managed Care - PPO | Source: Ambulatory Visit | Attending: Obstetrics and Gynecology | Admitting: Obstetrics and Gynecology

## 2015-09-26 DIAGNOSIS — E041 Nontoxic single thyroid nodule: Secondary | ICD-10-CM

## 2015-10-25 ENCOUNTER — Other Ambulatory Visit: Payer: Self-pay | Admitting: Obstetrics and Gynecology

## 2015-10-25 DIAGNOSIS — E041 Nontoxic single thyroid nodule: Secondary | ICD-10-CM

## 2015-11-06 ENCOUNTER — Other Ambulatory Visit: Payer: Self-pay | Admitting: Cardiology

## 2015-11-12 ENCOUNTER — Other Ambulatory Visit (HOSPITAL_COMMUNITY)
Admission: RE | Admit: 2015-11-12 | Discharge: 2015-11-12 | Disposition: A | Discharge status: Home / Self Care | Payer: Commercial Managed Care - PPO | Source: Ambulatory Visit | Attending: Physician Assistant | Admitting: Physician Assistant

## 2015-11-12 ENCOUNTER — Ambulatory Visit
Admission: RE | Admit: 2015-11-12 | Discharge: 2015-11-12 | Disposition: A | Discharge status: Home / Self Care | Payer: Commercial Managed Care - PPO | Source: Ambulatory Visit | Attending: Obstetrics and Gynecology | Admitting: Obstetrics and Gynecology

## 2015-11-12 ENCOUNTER — Other Ambulatory Visit: Payer: Self-pay | Admitting: Obstetrics and Gynecology

## 2015-11-12 DIAGNOSIS — E041 Nontoxic single thyroid nodule: Secondary | ICD-10-CM

## 2015-11-12 NOTE — Procedures (Signed)
Using direct ultrasound guidance, 4 passes were made using needles into the nodule within the right lobe of the thyroid.   Cystic component was aspirated = 2.5 mls cyst fluid.  Ultrasound was used to confirm needle placements on all occasions.   Specimens were sent to Pathology for analysis.   Hartley Wyke S Josefa Syracuse PA-C

## 2016-02-11 ENCOUNTER — Other Ambulatory Visit: Payer: Self-pay | Admitting: Cardiology

## 2016-03-08 ENCOUNTER — Other Ambulatory Visit: Payer: Self-pay | Admitting: Cardiology

## 2016-04-03 ENCOUNTER — Other Ambulatory Visit: Payer: Self-pay | Admitting: Cardiology

## 2016-04-26 ENCOUNTER — Other Ambulatory Visit: Payer: Self-pay | Admitting: Cardiology

## 2016-05-03 ENCOUNTER — Other Ambulatory Visit: Payer: Self-pay | Admitting: Cardiology

## 2016-05-28 ENCOUNTER — Other Ambulatory Visit: Payer: Self-pay | Admitting: Cardiology

## 2016-06-09 ENCOUNTER — Other Ambulatory Visit: Payer: Self-pay | Admitting: Cardiology

## 2016-06-24 ENCOUNTER — Other Ambulatory Visit: Payer: Self-pay | Admitting: Cardiology

## 2016-06-24 ENCOUNTER — Other Ambulatory Visit: Payer: Self-pay

## 2016-06-24 MED ORDER — POTASSIUM CHLORIDE ER 10 MEQ PO TBCR: 10.0000 meq | EXTENDED_RELEASE_TABLET | Freq: Every day | ORAL | 0 refills | Status: DC

## 2016-06-28 ENCOUNTER — Other Ambulatory Visit: Payer: Self-pay | Admitting: Cardiology

## 2016-07-16 ENCOUNTER — Other Ambulatory Visit: Payer: Self-pay | Admitting: Cardiology

## 2016-07-22 ENCOUNTER — Ambulatory Visit (INDEPENDENT_AMBULATORY_CARE_PROVIDER_SITE_OTHER): Payer: BLUE CROSS/BLUE SHIELD | Admitting: Physician Assistant

## 2016-07-22 ENCOUNTER — Encounter: Payer: Self-pay | Admitting: Physician Assistant

## 2016-07-22 VITALS — BP 130/70 | HR 70 | Ht 66.0 in | Wt 165.1 lb

## 2016-07-22 DIAGNOSIS — I10 Essential (primary) hypertension: Secondary | ICD-10-CM | POA: Diagnosis not present

## 2016-07-22 DIAGNOSIS — E7800 Pure hypercholesterolemia, unspecified: Secondary | ICD-10-CM

## 2016-07-22 MED ORDER — POTASSIUM CHLORIDE ER 10 MEQ PO TBCR: 10.0000 meq | EXTENDED_RELEASE_TABLET | Freq: Every day | ORAL | 3 refills | Status: DC

## 2016-07-22 MED ORDER — LISINOPRIL 20 MG PO TABS: 20.0000 mg | ORAL_TABLET | Freq: Every day | ORAL | 3 refills | Status: DC

## 2016-07-22 NOTE — Patient Instructions (Addendum)
Medication Instructions:  No changes.  Labwork: None   Testing/Procedures: None   Follow-Up: As needed with Tereso NewcomerScott Donnita Farina, PA-C   Any Other Special Instructions Will Be Listed Below (If Applicable). Labs from 12/2015:  LDL (bad cholesterol) was 69.  This should be under 100.  This is great. Total cholesterol 153 (under 200 is good); HDL 76 (good cholesterol - over 50 is good for women); Triglycerides (fatty part of cholesterol) 42 (under 150 is good).  If you need a refill on your cardiac medications before your next appointment, please call your pharmacy.

## 2016-07-22 NOTE — Progress Notes (Signed)
Cardiology Office Note:    Date:  07/22/2016   ID:  Teresa Martin, DOB 09-26-1960, MRN [id number]  PCP:  Juluis MireMCCOMB,Teresa S, MD  Cardiologist:  Dr. Marca Anconaalton McLean   Electrophysiologist:  [name] Endocrine: Dr. Talmage NapBalan  Referring MD: Teresa Martin, John, MD   Chief Complaint  Patient presents with  . Follow-up    HTN, HL   History of Present Illness:    Teresa Teresa Martin is a 55 y.o. female with a hx of HTN, hyperlipidemia, type II diabetes.  She has a strong FHx of CAD.  Her father died of a massive MI at 5159 and her grandfather died of a MI at 6950. She does exercise => primarily walking. Last seen in 7/16.  She returns for FU.  She is still exercising. She is overall doing well.  The patient denies chest pain, shortness of breath, syncope, orthopnea, PND or significant pedal edema.  Recent A1c with her endocrinologist was in the 6 range.    Prior CV studies that were reviewed today include:    CT Cardiac Scoring 04/2012 IMPRESSION: Coronary artery calcium score of 0 Agatston units places the patient in a low risk category for future cardiovascular events.  Past Medical History:  Diagnosis Date  . ADD (attention deficit disorder)   . DM2 (diabetes mellitus, type 2) (HCC)   . History of CT scan    Coronary artery calcium score 8/13: 0 Agatston units  . Hyperlipidemia   . Hypertension   . Perimenopausal     Past Surgical History:  Procedure Laterality Date  . KNEE SURGERY     Right Knee    Current Medications: Current Meds  Medication Sig  . aspirin EC 81 MG tablet Take 1 tablet (81 mg total) by mouth daily.  Marland Kitchen. atorvastatin (Teresa Martin) 10 MG tablet Take 1 tablet (10 mg total) by mouth daily at 6 PM.  . CONCERTA 36 MG CR tablet Take 36 mg by mouth daily as needed (CONCENTRATION).   Marland Kitchen. glimepiride (AMARYL) 1 MG tablet TAKE 1 TABLET WITH BREAKFAST OR THE FIRST MAIN MEAL OF THE DAY.  . hydrochlorothiazide (HYDRODIURIL) 25 MG tablet Take 1 tablet (25 mg total) by mouth daily.  Marland Kitchen. lisinopril  (PRINIVIL,ZESTRIL) 20 MG tablet Take 1 tablet (20 mg total) by mouth daily.  . metFORMIN (GLUCOPHAGE-XR) 500 MG 24 hr tablet Take 500 mg by mouth daily with breakfast. Take 2 in the a.m and 1 in the evening.  Teresa Martin. ONETOUCH DELICA LANCETS 33G MISC   . ONETOUCH VERIO test strip   . potassium chloride (KLOR-CON 10) 10 MEQ tablet Take 1 tablet (10 mEq total) by mouth daily.     Allergies:   Review of patient'Martin allergies indicates no known allergies.   Social History   Social History  . Marital status: Married    Spouse name: [name]  . Number of children: 3  . Years of education: [name]   Occupational History  . school teacher    Social History Main Topics  . Smoking status: Never Smoker  . Smokeless tobacco: Never Used  . Alcohol use No  . Drug use: No  . Sexual activity: Not Asked   Other Topics Concern  . None   Social History Narrative  . None     Family History:  The patient'Martin family history includes Diabetes in her brother; Heart attack (age of onset: 2649) in her paternal grandfather; Heart attack (age of onset: 4659) in her father; Hypertension in her father.   ROS:  Please see the history of present illness.    ROS All other systems reviewed and are negative.   EKGs/Labs/Other Test Reviewed:    EKG:  EKG is  ordered today.  The ekg ordered today demonstrates NSR, HR 70, normal axis, QTc 434 ms, no change since last tracing  Recent Labs: No results found for requested labs within last 8760 hours.   Recent Lipid Panel    Component Value Date/Time   CHOL 178 05/09/2013 1633   TRIG 110.0 05/09/2013 1633   HDL 55.60 05/09/2013 1633   CHOLHDL 3 05/09/2013 1633   VLDL 22.0 05/09/2013 1633   LDLCALC 100 (H) 05/09/2013 1633     Physical Exam:    VS:  BP 130/70   Pulse 70   Ht 5\' 6"  (1.676 m)   Wt 165 lb 1.9 oz (74.9 kg)   BMI 26.65 kg/m     Wt Readings from Last 3 Encounters:  07/22/16 165 lb 1.9 oz (74.9 kg)  04/17/15 167 lb (75.8 kg)  11/01/14 182 lb 1.6 oz  (82.6 kg)     Physical Exam  Constitutional: She is oriented to person, place, and time. She appears well-developed and well-nourished. No distress.  HENT:  Head: Normocephalic and atraumatic.  Eyes: No scleral icterus.  Neck: Normal range of motion. No JVD present. Carotid bruit is not present.  Cardiovascular: Normal rate, regular rhythm, S1 normal, S2 normal and normal heart sounds.   No murmur heard. Pulmonary/Chest: Breath sounds normal. She has no wheezes. She has no rhonchi. She has no rales.  Abdominal: Soft. There is no tenderness.  Musculoskeletal: She exhibits no edema.  Neurological: She is alert and oriented to person, place, and time.  Skin: Skin is warm and dry.  Psychiatric: She has a normal mood and affect.    ASSESSMENT:    1. Essential hypertension   2. Pure hypercholesterolemia    PLAN:    In order of problems listed above:  1. HTN - BP well controlled.  She would like to get her meds refilled with her endocrinologist which I think is ok.  Continue current Rx.  Creatinine was 0.75 in 4/17.  2. HL - LDL in 4/17 was 69, ALT 26.  Continue statin.   Dispo: FU here prn.  She can get all of her medications with PCP or Endocrinologist.    Medication Adjustments/Labs and Tests Ordered: Current medicines are reviewed at length with the patient today.  Concerns regarding medicines are outlined above.  Medication changes, Labs and Tests ordered today are outlined in the Patient Instructions noted below. Patient Instructions  Medication Instructions:  No changes.  Labwork: None   Testing/Procedures: None   Follow-Up: As needed with Tereso NewcomerScott Weaver, PA-C   Any Other Special Instructions Will Be Listed Below (If Applicable). Labs from 12/2015:  LDL (bad cholesterol) was 69.  This should be under 100.  This is great. Total cholesterol 153 (under 200 is good); HDL 76 (good cholesterol - over 50 is good for women); Triglycerides (fatty part of cholesterol) 42  (under 150 is good).  If you need a refill on your cardiac medications before your next appointment, please call your pharmacy.   Signed, Tereso NewcomerScott Weaver, PA-C  07/22/2016 10:34 AM    [facility] [address], [place], [place]  [address] Phone: [phone]) [phone]; Fax: [phone]

## 2016-07-23 ENCOUNTER — Other Ambulatory Visit: Payer: Self-pay | Admitting: Cardiology

## 2017-07-18 ENCOUNTER — Other Ambulatory Visit: Payer: Self-pay | Admitting: Physician Assistant

## 2017-07-18 DIAGNOSIS — I10 Essential (primary) hypertension: Secondary | ICD-10-CM

## 2017-08-21 ENCOUNTER — Other Ambulatory Visit: Payer: Self-pay | Admitting: Physician Assistant

## 2017-08-21 DIAGNOSIS — I10 Essential (primary) hypertension: Secondary | ICD-10-CM

## 2017-08-23 ENCOUNTER — Other Ambulatory Visit: Payer: Self-pay

## 2017-08-24 ENCOUNTER — Other Ambulatory Visit: Payer: Self-pay | Admitting: Physician Assistant

## 2017-08-24 DIAGNOSIS — I10 Essential (primary) hypertension: Secondary | ICD-10-CM

## 2017-08-24 MED ORDER — POTASSIUM CHLORIDE ER 10 MEQ PO TBCR: EXTENDED_RELEASE_TABLET | ORAL | 0 refills | Status: DC

## 2017-09-03 ENCOUNTER — Other Ambulatory Visit: Payer: Self-pay

## 2017-09-03 DIAGNOSIS — I10 Essential (primary) hypertension: Secondary | ICD-10-CM

## 2017-09-03 MED ORDER — POTASSIUM CHLORIDE ER 10 MEQ PO TBCR: EXTENDED_RELEASE_TABLET | ORAL | 0 refills | Status: DC

## 2017-09-06 MED ORDER — LISINOPRIL 20 MG PO TABS: ORAL_TABLET | ORAL | 0 refills | Status: DC

## 2017-09-06 NOTE — Addendum Note (Signed)
Addended by: Demetrios LollBARNARD, CATHY C on: 09/06/2017 01:25 PM   Modules accepted: Orders

## 2017-09-22 ENCOUNTER — Other Ambulatory Visit: Payer: Self-pay | Admitting: Physician Assistant

## 2017-09-22 DIAGNOSIS — I10 Essential (primary) hypertension: Secondary | ICD-10-CM

## 2017-10-02 ENCOUNTER — Other Ambulatory Visit: Payer: Self-pay | Admitting: Physician Assistant

## 2017-10-02 DIAGNOSIS — I10 Essential (primary) hypertension: Secondary | ICD-10-CM

## 2017-10-03 ENCOUNTER — Other Ambulatory Visit: Payer: Self-pay | Admitting: Physician Assistant

## 2017-10-03 DIAGNOSIS — I10 Essential (primary) hypertension: Secondary | ICD-10-CM

## 2017-10-06 ENCOUNTER — Telehealth: Payer: Self-pay | Admitting: Physician Assistant

## 2017-10-06 DIAGNOSIS — I10 Essential (primary) hypertension: Secondary | ICD-10-CM

## 2017-10-06 MED ORDER — LISINOPRIL 20 MG PO TABS: ORAL_TABLET | ORAL | 0 refills | Status: DC

## 2017-10-06 MED ORDER — POTASSIUM CHLORIDE ER 10 MEQ PO TBCR: EXTENDED_RELEASE_TABLET | ORAL | 0 refills | Status: DC

## 2017-10-06 NOTE — Telephone Encounter (Signed)
New message     *STAT* If patient is at the pharmacy, call can be transferred to refill team.   1. Which medications need to be refilled? (please list name of each medication and dose if known) lisinopril (PRINIVIL,ZESTRIL) 20 MG tablet  2. Which pharmacy/location (including street and city if local pharmacy) is medication to be sent to? [facility] [place] [facility] [place] - [address] [phone] (Phone)    3. Do they need a 30 day or 90 day supply? 30

## 2017-10-06 NOTE — Telephone Encounter (Signed)
New message      *STAT* If patient is at the pharmacy, call can be transferred to refill team.   1. Which medications need to be refilled? (please list name of each medication and dose if known) lisinopril (PRINIVIL,ZESTRIL) 20 MG tablet and potassium chloride (KLOR-CON 10) 10 MEQ tablet  2. Which pharmacy/location (including street and city if local pharmacy) is medication to be sent to[facility]  3. Do they need a 30 day or 90 day supply? 30

## 2017-10-06 NOTE — Telephone Encounter (Signed)
Pt's medication was sent to pt's pharmacy as requested. Confirmation received.  °

## 2017-10-21 ENCOUNTER — Encounter: Payer: Self-pay | Admitting: Internal Medicine

## 2017-10-22 ENCOUNTER — Encounter: Payer: Self-pay | Admitting: Internal Medicine

## 2017-10-22 ENCOUNTER — Ambulatory Visit (INDEPENDENT_AMBULATORY_CARE_PROVIDER_SITE_OTHER): Payer: BC Managed Care – PPO | Admitting: Internal Medicine

## 2017-10-22 ENCOUNTER — Encounter (INDEPENDENT_AMBULATORY_CARE_PROVIDER_SITE_OTHER): Payer: Self-pay

## 2017-10-22 VITALS — BP 130/70 | HR 97 | Resp 16 | Ht 66.0 in | Wt 159.2 lb

## 2017-10-22 DIAGNOSIS — E785 Hyperlipidemia, unspecified: Secondary | ICD-10-CM | POA: Diagnosis not present

## 2017-10-22 DIAGNOSIS — I10 Essential (primary) hypertension: Secondary | ICD-10-CM | POA: Diagnosis not present

## 2017-10-22 MED ORDER — LISINOPRIL 20 MG PO TABS: ORAL_TABLET | ORAL | 3 refills | Status: DC

## 2017-10-22 MED ORDER — ATORVASTATIN CALCIUM 10 MG PO TABS: 10.0000 mg | ORAL_TABLET | Freq: Every day | ORAL | 3 refills | Status: DC

## 2017-10-22 MED ORDER — HYDROCHLOROTHIAZIDE 25 MG PO TABS: 25.0000 mg | ORAL_TABLET | Freq: Every day | ORAL | 3 refills | Status: DC

## 2017-10-22 MED ORDER — POTASSIUM CHLORIDE ER 10 MEQ PO TBCR: EXTENDED_RELEASE_TABLET | ORAL | 3 refills | Status: DC

## 2017-10-22 MED ORDER — LISINOPRIL 20 MG PO TABS: ORAL_TABLET | ORAL | 0 refills | Status: DC

## 2017-10-22 MED ORDER — HYDROCHLOROTHIAZIDE 25 MG PO TABS: 25.0000 mg | ORAL_TABLET | Freq: Every day | ORAL | 0 refills | Status: DC

## 2017-10-22 MED ORDER — ATORVASTATIN CALCIUM 10 MG PO TABS: 10.0000 mg | ORAL_TABLET | Freq: Every day | ORAL | 0 refills | Status: DC

## 2017-10-22 NOTE — Progress Notes (Signed)
Follow-up Outpatient Visit Date: 10/22/2017  Primary Care Provider: Richardean ChimeraMcComb, John, MD [address] [place] [id number]  Chief Complaint: Follow-up hypertension and hyperlipidemia  HPI:  Teresa Martin is a 57 y.o. year-old female with history of hypertension, hyperlipidemia, and type 2 diabetes mellitus, who presents for follow-up of hypertension and hyperlipidemia.  She has been followed in our office by Dr. Shirlee LatchMcLean, given strong family history of coronary artery disease.  Prior workup was unrevealing with coronary calcium score of 0 in 2013.  Today Teresa Martin reports feeling well.  She denies chest pain, shortness of breath, palpitations, lightheadedness, orthopnea, PND, and edema.  She was recently seen by her endocrinologist and reports that her hemoglobin A1c is well controlled.  Remainder of her labs were also normal.  She requests refills of her cardiac medications today.  --------------------------------------------------------------------------------------------------  Past Medical History:  Diagnosis Date  . ADD (attention deficit disorder)   . DM2 (diabetes mellitus, type 2) (HCC)   . History of CT scan    Coronary artery calcium score 8/13: 0 Agatston units  . Hyperlipidemia   . Hypertension   . Perimenopausal    Past Surgical History:  Procedure Laterality Date  . KNEE SURGERY     Right Knee    Current Meds  Medication Sig  . aspirin EC 81 MG tablet Take 1 tablet (81 mg total) by mouth daily.  Marland Kitchen. atorvastatin (LIPITOR) 10 MG tablet Take 1 tablet (10 mg total) by mouth daily at 6 PM.  . CONCERTA 36 MG CR tablet Take 36 mg by mouth daily as needed (CONCENTRATION).   Marland Kitchen. glimepiride (AMARYL) 1 MG tablet TAKE 1 TABLET WITH BREAKFAST OR THE FIRST MAIN MEAL OF THE DAY.  . hydrochlorothiazide (HYDRODIURIL) 25 MG tablet Take 1 tablet (25 mg total) by mouth daily.  Marland Kitchen. lisinopril (PRINIVIL,ZESTRIL) 20 MG tablet Take 1 tablet by mouth daily. Please keep upcoming appt. Final  Attempt  . metFORMIN (GLUCOPHAGE-XR) 500 MG 24 hr tablet Take 500 mg by mouth daily with breakfast. Take 2 in the a.m and 1 in the evening.  [name]. ONETOUCH DELICA LANCETS 33G MISC   . ONETOUCH VERIO test strip   . potassium chloride (KLOR-CON 10) 10 MEQ tablet Take 1 tablet by mouth daily. Please keep upcoming appt for future refills. Thank you    Allergies: Patient has no known allergies.  Social History   Socioeconomic History  . Marital status: Married    Spouse name: Not on file  . Number of children: 3  . Years of education: Not on file  . Highest education level: Not on file  Social Needs  . Financial resource strain: Not on file  . Food insecurity - worry: Not on file  . Food insecurity - inability: Not on file  . Transportation needs - medical: Not on file  . Transportation needs - non-medical: Not on file  Occupational History  . Occupation: Engineer, siteschool teacher  Tobacco Use  . Smoking status: Never Smoker  . Smokeless tobacco: Never Used  Substance and Sexual Activity  . Alcohol use: No  . Drug use: No  . Sexual activity: Not on file  Other Topics Concern  . Not on file  Social History Narrative  . Not on file    Family History  Problem Relation Age of Onset  . Hypertension Father   . Heart attack Father 2659  . Heart attack Paternal Grandfather 2849  . Coronary artery disease Unknown   . Diabetes  Brother    --------------------------------------------------------------------------------------------------  Physical Exam: BP 130/70   Pulse 97   Resp 16   Ht 5\' 6"  (1.676 m)   Wt 159 lb 3.2 oz (72.2 kg)   LMP  (LMP Unknown)   SpO2 97%   BMI 25.70 kg/m  General: Well-developed, well-nourished woman, seated comfortably in the exam room. Heart: Regular rate and rhythm without murmurs. Lungs: Clear to auscultation bilaterally. Extremities: No lower extremity edema.  2+ pedal pulses bilaterally.  Lab Results  Component Value Date   WBC 5.6 04/11/2010   HGB 13.7  04/11/2010   HCT 41.0 04/11/2010   MCV 94.2 04/11/2010   PLT 310 04/11/2010    Lab Results  Component Value Date   NA 134 (L) 03/07/2015   K 3.8 03/07/2015   CL 100 03/07/2015   CO2 27 03/07/2015   BUN 19 03/07/2015   CREATININE 0.80 03/07/2015   GLUCOSE 182 (H) 03/07/2015    Lab Results  Component Value Date   CHOL 178 05/09/2013   HDL 55.60 05/09/2013   LDLCALC 100 (H) 05/09/2013   TRIG 110.0 05/09/2013   CHOLHDL 3 05/09/2013    --------------------------------------------------------------------------------------------------  ASSESSMENT AND PLAN: Hyperlipidemia We will request labs from Teresa Martin's endocrinologist.  Continue current dose of atorvastatin.  Hypertension Blood pressure well controlled.  We will request labs from Teresa Martin's endocrinologist.  She should continue current doses of HCTZ, lisinopril, and potassium chloride.  Follow-up: Return to clinic as needed.  PCP and/or endocrinologist can continue ongoing management of hypertension and hyperlipidemia.  Yvonne Kendall, MD 10/22/2017 4:26 PM

## 2017-10-22 NOTE — Addendum Note (Signed)
Addended by: Launi Asencio A on: 10/22/2017 07:56 PM   Modules accepted: Orders

## 2017-10-22 NOTE — Patient Instructions (Signed)
Medication Instructions:  Your physician recommends that you continue on your current medications as directed. Please refer to the Current Medication list given to you today.  -- If you need a refill on your cardiac medications before your next appointment, please call your pharmacy. --  Labwork: None ordered  Testing/Procedures: None ordered  Follow-Up: Your physician wants you to follow-up as needed  You will receive a reminder letter in the mail two months in advance. If you don't receive a letter, please call our office to schedule the follow-up appointment.  Thank you for choosing [facility]!!    Any Other Special Instructions Will Be Listed Below (If Applicable).

## 2018-10-27 ENCOUNTER — Other Ambulatory Visit: Payer: Self-pay | Admitting: Internal Medicine

## 2018-11-28 ENCOUNTER — Other Ambulatory Visit: Payer: Self-pay | Admitting: Internal Medicine

## 2018-11-28 DIAGNOSIS — I10 Essential (primary) hypertension: Secondary | ICD-10-CM

## 2018-11-30 ENCOUNTER — Telehealth: Payer: Self-pay

## 2018-11-30 DIAGNOSIS — I10 Essential (primary) hypertension: Secondary | ICD-10-CM

## 2018-11-30 MED ORDER — POTASSIUM CHLORIDE ER 10 MEQ PO TBCR: EXTENDED_RELEASE_TABLET | ORAL | 0 refills | Status: DC

## 2018-11-30 NOTE — Telephone Encounter (Addendum)
Please advise pt requesting refill of Potassium last refill noted pt to contact office for an appointment.

## 2018-11-30 NOTE — Telephone Encounter (Signed)
Would refill # 30 w/ no refills. She should have this filled by her PCP going forward as she was a PRN follow up with Dr. Okey Dupre at her last office visit- 10/22/17.

## 2018-12-27 ENCOUNTER — Other Ambulatory Visit: Payer: Self-pay | Admitting: Internal Medicine

## 2018-12-27 ENCOUNTER — Telehealth: Payer: Self-pay

## 2018-12-27 DIAGNOSIS — I10 Essential (primary) hypertension: Secondary | ICD-10-CM

## 2018-12-27 NOTE — Telephone Encounter (Signed)
-----   Message from Aurelio Jew, [place] sent at 12/27/2018  7:49 AM EDT ----- Needs appointment for further refills.

## 2018-12-27 NOTE — Telephone Encounter (Signed)
-----   Message from Kathy M Woodward, RMA sent at 12/27/2018  7:49 AM EDT ----- °Needs appointment for further refills.  ° °

## 2018-12-27 NOTE — Telephone Encounter (Signed)
lmov to schedule appt  °

## 2019-02-15 NOTE — Telephone Encounter (Signed)
lmov

## 2019-03-14 NOTE — Telephone Encounter (Signed)
Patient declined further appt .  Getting meds with Endocrinology.  Closing Encounter.

## 2019-07-27 ENCOUNTER — Telehealth: Payer: Self-pay | Admitting: Genetic Counselor

## 2019-07-27 NOTE — Telephone Encounter (Signed)
A genetic counseling appt has been scheduled to see Teresa Martin on 11/25 at 2pm. Pt aware to arrive 15 minutes early. I provided the contact number [phone] for Ms. Medellin to call and speak to a genetic counselor regarding cost of testing.

## 2019-08-11 ENCOUNTER — Telehealth: Payer: Self-pay | Admitting: Genetic Counselor

## 2019-08-11 NOTE — Telephone Encounter (Signed)
Called patient regarding upcoming [facility] appointment, this will be a walk-in visit. Left a voicemail.

## 2019-08-15 ENCOUNTER — Encounter: Payer: Self-pay | Admitting: Genetic Counselor

## 2019-08-16 ENCOUNTER — Inpatient Hospital Stay: Payer: BC Managed Care – PPO | Admitting: Genetic Counselor

## 2019-08-16 ENCOUNTER — Telehealth: Payer: Self-pay | Admitting: Genetic Counselor

## 2019-08-16 ENCOUNTER — Inpatient Hospital Stay: Payer: BC Managed Care – PPO

## 2019-08-16 ENCOUNTER — Other Ambulatory Visit: Payer: Self-pay | Admitting: Genetic Counselor

## 2019-08-16 DIAGNOSIS — Z86010 Personal history of colonic polyps: Secondary | ICD-10-CM

## 2019-08-16 NOTE — Telephone Encounter (Signed)
Returned patient's phone call regarding 11/25 appointment, patient will call to reschedule when ready.

## 2021-08-18 ENCOUNTER — Inpatient Hospital Stay (HOSPITAL_COMMUNITY): Payer: BC Managed Care – PPO | Admitting: Anesthesiology

## 2021-08-18 ENCOUNTER — Other Ambulatory Visit: Payer: Self-pay

## 2021-08-18 ENCOUNTER — Emergency Department (HOSPITAL_COMMUNITY): Payer: BC Managed Care – PPO

## 2021-08-18 ENCOUNTER — Encounter (HOSPITAL_COMMUNITY)
Admission: EM | Disposition: A | Discharge status: Home / Self Care | Payer: Self-pay | Source: Home / Self Care | Attending: Emergency Medicine

## 2021-08-18 ENCOUNTER — Other Ambulatory Visit: Payer: Self-pay | Admitting: Urology

## 2021-08-18 ENCOUNTER — Inpatient Hospital Stay (HOSPITAL_COMMUNITY): Payer: BC Managed Care – PPO

## 2021-08-18 ENCOUNTER — Encounter (HOSPITAL_COMMUNITY): Payer: Self-pay | Admitting: Emergency Medicine

## 2021-08-18 ENCOUNTER — Observation Stay (HOSPITAL_COMMUNITY)
Admission: EM | Admit: 2021-08-18 | Discharge: 2021-08-19 | Disposition: A | Discharge status: Home / Self Care | Payer: BC Managed Care – PPO | Attending: Internal Medicine | Admitting: Internal Medicine

## 2021-08-18 DIAGNOSIS — N200 Calculus of kidney: Secondary | ICD-10-CM

## 2021-08-18 DIAGNOSIS — Z7984 Long term (current) use of oral hypoglycemic drugs: Secondary | ICD-10-CM | POA: Insufficient documentation

## 2021-08-18 DIAGNOSIS — Z20822 Contact with and (suspected) exposure to covid-19: Secondary | ICD-10-CM | POA: Diagnosis not present

## 2021-08-18 DIAGNOSIS — Z7982 Long term (current) use of aspirin: Secondary | ICD-10-CM | POA: Diagnosis not present

## 2021-08-18 DIAGNOSIS — Z79899 Other long term (current) drug therapy: Secondary | ICD-10-CM | POA: Insufficient documentation

## 2021-08-18 DIAGNOSIS — N201 Calculus of ureter: Principal | ICD-10-CM | POA: Insufficient documentation

## 2021-08-18 DIAGNOSIS — E119 Type 2 diabetes mellitus without complications: Secondary | ICD-10-CM

## 2021-08-18 DIAGNOSIS — I10 Essential (primary) hypertension: Secondary | ICD-10-CM | POA: Diagnosis present

## 2021-08-18 DIAGNOSIS — E785 Hyperlipidemia, unspecified: Secondary | ICD-10-CM | POA: Diagnosis present

## 2021-08-18 DIAGNOSIS — F988 Other specified behavioral and emotional disorders with onset usually occurring in childhood and adolescence: Secondary | ICD-10-CM | POA: Diagnosis present

## 2021-08-18 DIAGNOSIS — N139 Obstructive and reflux uropathy, unspecified: Secondary | ICD-10-CM | POA: Diagnosis present

## 2021-08-18 HISTORY — PX: CYSTOSCOPY W/ URETERAL STENT PLACEMENT: SHX1429

## 2021-08-18 LAB — CBC WITH DIFFERENTIAL/PLATELET
Abs Immature Granulocytes: 0.08 10*3/uL — ABNORMAL HIGH (ref 0.00–0.07)
Basophils Absolute: 0 10*3/uL (ref 0.0–0.1)
Basophils Relative: 0 %
Eosinophils Absolute: 0 10*3/uL (ref 0.0–0.5)
Eosinophils Relative: 0 %
HCT: 39.7 % (ref 36.0–46.0)
Hemoglobin: 13.6 g/dL (ref 12.0–15.0)
Immature Granulocytes: 1 %
Lymphocytes Relative: 8 %
Lymphs Abs: 0.9 10*3/uL (ref 0.7–4.0)
MCH: 31 pg (ref 26.0–34.0)
MCHC: 34.3 g/dL (ref 30.0–36.0)
MCV: 90.4 fL (ref 80.0–100.0)
Monocytes Absolute: 0.8 10*3/uL (ref 0.1–1.0)
Monocytes Relative: 6 %
Neutro Abs: 10.3 10*3/uL — ABNORMAL HIGH (ref 1.7–7.7)
Neutrophils Relative %: 85 %
Platelets: 405 10*3/uL — ABNORMAL HIGH (ref 150–400)
RBC: 4.39 MIL/uL (ref 3.87–5.11)
RDW: 13 % (ref 11.5–15.5)
WBC: 12.1 10*3/uL — ABNORMAL HIGH (ref 4.0–10.5)
nRBC: 0 % (ref 0.0–0.2)

## 2021-08-18 LAB — URINALYSIS, ROUTINE W REFLEX MICROSCOPIC
Bilirubin Urine: NEGATIVE
Glucose, UA: 150 mg/dL — AB
Ketones, ur: 20 mg/dL — AB
Nitrite: NEGATIVE
Protein, ur: 30 mg/dL — AB
RBC / HPF: >50 RBC/hpf — ABNORMAL HIGH (ref 0–5)
Specific Gravity, Urine: 1.025 (ref 1.005–1.030)
Trans Epithel, UA: 2
WBC, UA: >50 WBC/hpf — ABNORMAL HIGH (ref 0–5)
pH: 5 (ref 5.0–8.0)

## 2021-08-18 LAB — COMPREHENSIVE METABOLIC PANEL
ALT: 26 U/L (ref 0–44)
AST: 32 U/L (ref 15–41)
Albumin: 4.2 g/dL (ref 3.5–5.0)
Alkaline Phosphatase: 70 U/L (ref 38–126)
Anion gap: 10 (ref 5–15)
BUN: 21 mg/dL — ABNORMAL HIGH (ref 6–20)
CO2: 24 mmol/L (ref 22–32)
Calcium: 9.3 mg/dL (ref 8.9–10.3)
Chloride: 97 mmol/L — ABNORMAL LOW (ref 98–111)
Creatinine, Ser: 0.94 mg/dL (ref 0.44–1.00)
GFR, Estimated: >60 mL/min (ref >60)
Glucose, Bld: 214 mg/dL — ABNORMAL HIGH (ref 70–99)
Potassium: 3.7 mmol/L (ref 3.5–5.1)
Sodium: 131 mmol/L — ABNORMAL LOW (ref 135–145)
Total Bilirubin: 0.7 mg/dL (ref 0.3–1.2)
Total Protein: 7 g/dL (ref 6.5–8.1)

## 2021-08-18 LAB — GLUCOSE, CAPILLARY
Glucose-Capillary: 139 mg/dL — ABNORMAL HIGH (ref 70–99)
Glucose-Capillary: 135 mg/dL — ABNORMAL HIGH (ref 70–99)
Glucose-Capillary: 271 mg/dL — ABNORMAL HIGH (ref 70–99)

## 2021-08-18 LAB — I-STAT BETA HCG BLOOD, ED (MC, WL, AP ONLY): I-stat hCG, quantitative: <5 m[IU]/mL (ref <5)

## 2021-08-18 LAB — SARS CORONAVIRUS 2 BY RT PCR (HOSPITAL ORDER, PERFORMED IN ~~LOC~~ HOSPITAL LAB): SARS Coronavirus 2: NEGATIVE

## 2021-08-18 LAB — LIPASE, BLOOD: Lipase: 50 U/L (ref 11–51)

## 2021-08-18 SURGERY — CYSTOSCOPY, WITH RETROGRADE PYELOGRAM AND URETERAL STENT INSERTION
Anesthesia: Choice | Laterality: Left

## 2021-08-18 SURGERY — CYSTOSCOPY, FLEXIBLE, WITH STENT REPLACEMENT
Anesthesia: General | Laterality: Left

## 2021-08-18 MED ORDER — CHLORHEXIDINE GLUCONATE 0.12 % MT SOLN
15.0000 mL | OROMUCOSAL | Status: AC
  Administered 2021-08-18: 17:00:00 15 mL via OROMUCOSAL

## 2021-08-18 MED ORDER — ACETAMINOPHEN 325 MG PO TABS: 650.0000 mg | ORAL_TABLET | Freq: Four times a day (QID) | ORAL | Status: DC | PRN

## 2021-08-18 MED ORDER — LACTATED RINGERS IV SOLN: INTRAVENOUS | Status: DC

## 2021-08-18 MED ORDER — ONDANSETRON HCL 4 MG/2ML IJ SOLN
4.0000 mg | Freq: Once | INTRAMUSCULAR | Status: AC
  Administered 2021-08-18: 09:00:00 4 mg via INTRAVENOUS
  Filled 2021-08-18: qty 2

## 2021-08-18 MED ORDER — OXYCODONE HCL 5 MG PO TABS: 5.0000 mg | ORAL_TABLET | ORAL | Status: DC | PRN

## 2021-08-18 MED ORDER — HYDROMORPHONE HCL 1 MG/ML IJ SOLN
INTRAMUSCULAR | Status: AC
  Filled 2021-08-18: qty 1

## 2021-08-18 MED ORDER — AMISULPRIDE (ANTIEMETIC) 5 MG/2ML IV SOLN
10.0000 mg | Freq: Once | INTRAVENOUS | Status: DC | PRN
  Filled 2021-08-18: qty 4

## 2021-08-18 MED ORDER — ONDANSETRON HCL 4 MG/2ML IJ SOLN: 4.0000 mg | Freq: Once | INTRAMUSCULAR | Status: DC | PRN

## 2021-08-18 MED ORDER — SODIUM CHLORIDE 0.9 % IR SOLN
Status: DC | PRN
  Administered 2021-08-18: 3000 mL

## 2021-08-18 MED ORDER — FENTANYL CITRATE (PF) 100 MCG/2ML IJ SOLN
INTRAMUSCULAR | Status: AC
  Filled 2021-08-18: qty 2

## 2021-08-18 MED ORDER — LIDOCAINE 2% (20 MG/ML) 5 ML SYRINGE
INTRAMUSCULAR | Status: DC | PRN
  Administered 2021-08-18: 100 mg via INTRAVENOUS

## 2021-08-18 MED ORDER — INSULIN ASPART 100 UNIT/ML IJ SOLN: 0.0000 [IU] | Freq: Three times a day (TID) | INTRAMUSCULAR | Status: DC

## 2021-08-18 MED ORDER — ONDANSETRON HCL 4 MG/2ML IJ SOLN
4.0000 mg | Freq: Four times a day (QID) | INTRAMUSCULAR | Status: DC | PRN
  Administered 2021-08-18: 13:00:00 4 mg via INTRAVENOUS
  Filled 2021-08-18: qty 2

## 2021-08-18 MED ORDER — ENOXAPARIN SODIUM 40 MG/0.4ML IJ SOSY
40.0000 mg | PREFILLED_SYRINGE | INTRAMUSCULAR | Status: DC
  Administered 2021-08-18: 13:00:00 40 mg via SUBCUTANEOUS
  Filled 2021-08-18: qty 0.4

## 2021-08-18 MED ORDER — ONDANSETRON HCL 4 MG PO TABS: 4.0000 mg | ORAL_TABLET | Freq: Four times a day (QID) | ORAL | Status: DC | PRN

## 2021-08-18 MED ORDER — EPHEDRINE 5 MG/ML INJ
INTRAVENOUS | Status: AC
  Filled 2021-08-18: qty 5

## 2021-08-18 MED ORDER — ALBUTEROL SULFATE (2.5 MG/3ML) 0.083% IN NEBU: 2.5000 mg | INHALATION_SOLUTION | RESPIRATORY_TRACT | Status: DC | PRN

## 2021-08-18 MED ORDER — PROPOFOL 10 MG/ML IV BOLUS
INTRAVENOUS | Status: DC | PRN
  Administered 2021-08-18: 130 mg via INTRAVENOUS

## 2021-08-18 MED ORDER — BISACODYL 5 MG PO TBEC: 5.0000 mg | DELAYED_RELEASE_TABLET | Freq: Every day | ORAL | Status: DC | PRN

## 2021-08-18 MED ORDER — HYDROMORPHONE HCL 1 MG/ML IJ SOLN: 0.5000 mg | INTRAMUSCULAR | Status: DC | PRN

## 2021-08-18 MED ORDER — INSULIN ASPART 100 UNIT/ML IJ SOLN
0.0000 [IU] | Freq: Every day | INTRAMUSCULAR | Status: DC
  Administered 2021-08-18: 22:00:00 3 [IU] via SUBCUTANEOUS

## 2021-08-18 MED ORDER — DOCUSATE SODIUM 100 MG PO CAPS
100.0000 mg | ORAL_CAPSULE | Freq: Two times a day (BID) | ORAL | Status: DC
  Administered 2021-08-18 – 2021-08-19 (×3): 100 mg via ORAL
  Filled 2021-08-18 (×4): qty 1

## 2021-08-18 MED ORDER — MORPHINE SULFATE (PF) 4 MG/ML IV SOLN
4.0000 mg | Freq: Once | INTRAVENOUS | Status: AC
  Administered 2021-08-18: 09:00:00 4 mg via INTRAVENOUS
  Filled 2021-08-18: qty 1

## 2021-08-18 MED ORDER — MORPHINE SULFATE (PF) 4 MG/ML IV SOLN
4.0000 mg | Freq: Once | INTRAVENOUS | Status: AC
  Administered 2021-08-18: 10:00:00 4 mg via INTRAVENOUS
  Filled 2021-08-18: qty 1

## 2021-08-18 MED ORDER — DEXAMETHASONE SODIUM PHOSPHATE 10 MG/ML IJ SOLN
INTRAMUSCULAR | Status: DC | PRN
  Administered 2021-08-18: 4 mg via INTRAVENOUS

## 2021-08-18 MED ORDER — POLYETHYLENE GLYCOL 3350 17 G PO PACK: 17.0000 g | PACK | Freq: Every day | ORAL | Status: DC | PRN

## 2021-08-18 MED ORDER — OXYCODONE HCL 5 MG/5ML PO SOLN: 5.0000 mg | Freq: Once | ORAL | Status: DC | PRN

## 2021-08-18 MED ORDER — PHENYLEPHRINE 40 MCG/ML (10ML) SYRINGE FOR IV PUSH (FOR BLOOD PRESSURE SUPPORT)
PREFILLED_SYRINGE | INTRAVENOUS | Status: AC
  Filled 2021-08-18: qty 30

## 2021-08-18 MED ORDER — SODIUM CHLORIDE 0.9 % IV SOLN
1.0000 g | INTRAVENOUS | Status: DC
  Administered 2021-08-19: 1 g via INTRAVENOUS
  Filled 2021-08-18: qty 10

## 2021-08-18 MED ORDER — HYDROMORPHONE HCL 1 MG/ML IJ SOLN
1.0000 mg | Freq: Once | INTRAMUSCULAR | Status: AC
  Administered 2021-08-18: 13:00:00 1 mg via INTRAVENOUS
  Filled 2021-08-18: qty 1

## 2021-08-18 MED ORDER — ACETAMINOPHEN 650 MG RE SUPP: 650.0000 mg | Freq: Four times a day (QID) | RECTAL | Status: DC | PRN

## 2021-08-18 MED ORDER — KETOROLAC TROMETHAMINE 30 MG/ML IJ SOLN: 30.0000 mg | Freq: Once | INTRAMUSCULAR | Status: DC

## 2021-08-18 MED ORDER — HYDROMORPHONE HCL 1 MG/ML IJ SOLN
0.5000 mg | INTRAMUSCULAR | Status: AC
  Administered 2021-08-18: 17:00:00 0.5 mg via INTRAVENOUS

## 2021-08-18 MED ORDER — PHENYLEPHRINE 40 MCG/ML (10ML) SYRINGE FOR IV PUSH (FOR BLOOD PRESSURE SUPPORT)
PREFILLED_SYRINGE | INTRAVENOUS | Status: DC | PRN
  Administered 2021-08-18: 120 ug via INTRAVENOUS
  Administered 2021-08-18: 40 ug via INTRAVENOUS
  Administered 2021-08-18 (×2): 120 ug via INTRAVENOUS

## 2021-08-18 MED ORDER — SODIUM CHLORIDE 0.9 % IV SOLN
1.0000 g | Freq: Once | INTRAVENOUS | Status: AC
  Administered 2021-08-18: 10:00:00 1 g via INTRAVENOUS
  Filled 2021-08-18: qty 10

## 2021-08-18 MED ORDER — ONDANSETRON HCL 4 MG/2ML IJ SOLN
INTRAMUSCULAR | Status: DC | PRN
  Administered 2021-08-18: 4 mg via INTRAVENOUS

## 2021-08-18 MED ORDER — ATORVASTATIN CALCIUM 10 MG PO TABS: 10.0000 mg | ORAL_TABLET | Freq: Every day | ORAL | Status: DC

## 2021-08-18 MED ORDER — OXYCODONE HCL 5 MG PO TABS: 5.0000 mg | ORAL_TABLET | Freq: Once | ORAL | Status: DC | PRN

## 2021-08-18 MED ORDER — SODIUM CHLORIDE 0.9 % IV BOLUS
1000.0000 mL | Freq: Once | INTRAVENOUS | Status: AC
  Administered 2021-08-18: 09:00:00 1000 mL via INTRAVENOUS

## 2021-08-18 MED ORDER — ROSUVASTATIN CALCIUM 10 MG PO TABS
10.0000 mg | ORAL_TABLET | Freq: Every day | ORAL | Status: DC
  Administered 2021-08-18: 22:00:00 10 mg via ORAL
  Filled 2021-08-18: qty 1

## 2021-08-18 MED ORDER — HYDRALAZINE HCL 20 MG/ML IJ SOLN: 5.0000 mg | INTRAMUSCULAR | Status: DC | PRN

## 2021-08-18 MED ORDER — FENTANYL CITRATE (PF) 100 MCG/2ML IJ SOLN
INTRAMUSCULAR | Status: DC | PRN
  Administered 2021-08-18: 25 ug via INTRAVENOUS

## 2021-08-18 MED ORDER — ACETAMINOPHEN 10 MG/ML IV SOLN
1000.0000 mg | Freq: Once | INTRAVENOUS | Status: DC | PRN
  Filled 2021-08-18: qty 100

## 2021-08-18 MED ORDER — HYDROMORPHONE HCL 1 MG/ML IJ SOLN: 0.2500 mg | INTRAMUSCULAR | Status: DC | PRN

## 2021-08-18 SURGICAL SUPPLY — 8 items
BAG URO CATCHER STRL LF (MISCELLANEOUS) ×2 IMPLANT
CATH URETL OPEN 5X70 (CATHETERS) IMPLANT
CLOTH BEACON ORANGE TIMEOUT ST (SAFETY) ×2 IMPLANT
GLOVE SURG POLYISO LF SZ8 (GLOVE) IMPLANT
GOWN STRL REUS W/TWL XL LVL3 (GOWN DISPOSABLE) ×2 IMPLANT
MANIFOLD NEPTUNE II (INSTRUMENTS) ×2 IMPLANT
TUBING CONNECTING 10 (TUBING) ×2 IMPLANT
TUBING UROLOGY SET (TUBING) IMPLANT

## 2021-08-18 SURGICAL SUPPLY — 11 items
BAG URO CATCHER STRL LF (MISCELLANEOUS) ×2 IMPLANT
CATH URETL OPEN 5X70 (CATHETERS) ×1 IMPLANT
CLOTH BEACON ORANGE TIMEOUT ST (SAFETY) ×2 IMPLANT
GLOVE SURG POLYISO LF SZ8 (GLOVE) IMPLANT
GOWN STRL REUS W/TWL XL LVL3 (GOWN DISPOSABLE) ×2 IMPLANT
GUIDEWIRE STR DUAL SENSOR (WIRE) ×2 IMPLANT
MANIFOLD NEPTUNE II (INSTRUMENTS) ×2 IMPLANT
PACK CYSTO (CUSTOM PROCEDURE TRAY) ×2 IMPLANT
STENT URET 6FRX24 CONTOUR (STENTS) ×1 IMPLANT
TUBING CONNECTING 10 (TUBING) ×2 IMPLANT
TUBING UROLOGY SET (TUBING) IMPLANT

## 2021-08-18 NOTE — H&P (Signed)
History and Physical    Teresa Martin HEN:[id number] DOB: 15-Sep-1961 DOA: 08/18/2021  PCP: Richardean Chimera, MD Consultants:  Aundria Rud - orthopedics Patient coming from:  Home - lives with husband; NOK: Terricka, Onofrio, [phone]  Chief Complaint: L groin/flank pain  HPI: Teresa Martin is a 60 y.o. female with medical history significant of DM; ADD; HTN; and HLD presenting with  groin/flank pain.  She reports of strong FH of nephrolithiasis but no prior history.  About 2 weeks ago, she developed periodic colicky pain of the LLQ/L flank.  Yesterday at church, the pain intensified.  It is now disabling.  She has mild pelvic fullness in addition to the discomfort and pelvic burning sensation without clear urinary symptoms.      ED Course: Infected stone.  Dr. Annabell Howells requests admission to [facility].  N/V, LLQ and flank pain.  Has 5 mm obstructing stone with UTI.  WBC 12.1.  No AKI.  8 mg morphine, 1 mg Dilaudid.  Needs stent placement.  Review of Systems: As per HPI; otherwise review of systems reviewed and negative.   Ambulatory Status:  Ambulates without assistance   Past Medical History:  Diagnosis Date   ADD (attention deficit disorder)    DM2 (diabetes mellitus, type 2) (HCC)    History of CT scan    Coronary artery calcium score 8/13: 0 Agatston units   Hyperlipidemia    Hypertension    Perimenopausal     Past Surgical History:  Procedure Laterality Date   KNEE SURGERY     Right Knee    Social History   Socioeconomic History   Marital status: Married    Spouse name: Not on file   Number of children: 3   Years of education: Not on file   Highest education level: Not on file  Occupational History   Occupation: middle school teacher    Comment: [place]  Tobacco Use   Smoking status: Never   Smokeless tobacco: Never  Vaping Use   Vaping Use: Never used  Substance and Sexual Activity   Alcohol use: Yes    Comment: occasional   Drug use: No   Sexual activity: Not on  file  Other Topics Concern   Not on file  Social History Narrative   Not on file   Social Determinants of Health   Financial Resource Strain: Not on file  Food Insecurity: Not on file  Transportation Needs: Not on file  Physical Activity: Not on file  Stress: Not on file  Social Connections: Not on file  Intimate Partner Violence: Not on file    No Known Allergies  Family History  Problem Relation Age of Onset   Hypertension Father    Heart attack Father 20   Heart attack Paternal Grandfather 32   Coronary artery disease Other    Diabetes Brother     Prior to Admission medications   Medication Sig Start Date End Date Taking? Authorizing Provider  aspirin EC 81 MG tablet Take 1 tablet (81 mg total) by mouth daily. 04/26/12   Laurey Morale, MD  atorvastatin (LIPITOR) 10 MG tablet TAKE 1 TABLET (10 MG TOTAL) BY MOUTH DAILY AT 6 PM. 10/27/18   Gollan, Tollie Pizza, MD  CONCERTA 36 MG CR tablet Take 36 mg by mouth daily as needed (CONCENTRATION).  07/20/16   [provider]  glimepiride (AMARYL) 1 MG tablet TAKE 1 TABLET WITH BREAKFAST OR THE FIRST MAIN MEAL OF THE DAY. 06/15/16   [provider]  hydrochlorothiazide (HYDRODIURIL) 25 MG tablet TAKE 1 TABLET BY MOUTH EVERY DAY 11/29/18   End, Cristal Deer, MD  lisinopril (PRINIVIL,ZESTRIL) 20 MG tablet Take 1 tablet by mouth daily. 11/29/18   End, Cristal Deer, MD  metFORMIN (GLUCOPHAGE-XR) 500 MG 24 hr tablet Take 500 mg by mouth daily with breakfast. Take 2 in the a.m and 1 in the evening.    [provider]  [facility] Argyle LANCETS 33G MISC  09/11/14   [provider]  [facility] VERIO test strip  09/12/14   [provider]  potassium chloride (K-DUR) 10 MEQ tablet TAKE 1 TABLET BY MOUTH DAILY. PLEASE KEEP UPCOMING APPT FOR FUTURE REFILLS. THANK YOU 12/27/18   End, Cristal Deer, MD    Physical Exam: Vitals:   08/18/21 1015 08/18/21 1030 08/18/21 1045 08/18/21 1100  BP: (!) 171/111 138/61 (!)  160/120 (!) 144/93  Pulse: 80 72 84 64  Resp: 17 12 16 13   Temp:      SpO2: 96% 93% 93% 92%     General:  Appears very uncomfortable, fidgety Eyes:   EOMI, normal lids, iris ENT:  grossly normal hearing, lips & tongue, mmm Neck:  no LAD, masses or thyromegaly Cardiovascular:  RRR, no m/r/g. No LE edema.  Respiratory:   CTA bilaterally with no wheezes/rales/rhonchi.  Normal respiratory effort. Abdomen:  soft, LLQ TTP Back:   +L CVAT Skin:  no rash or induration seen on limited exam Musculoskeletal:  grossly normal tone BUE/BLE, good ROM, no bony abnormality Psychiatric:  blunted mood and affect, speech fluent and appropriate, AOx3 Neurologic:  CN 2-12 grossly intact, moves all extremities in coordinated fashion    Radiological Exams on Admission: Independently reviewed - see discussion in A/P where applicable  CT Renal Stone Study  Result Date: 08/18/2021 CLINICAL DATA:  Acute left flank and groin pain for 2 weeks, history of nephrolithiasis EXAM: CT ABDOMEN AND PELVIS WITHOUT CONTRAST TECHNIQUE: Multidetector CT imaging of the abdomen and pelvis was performed following the standard protocol without IV contrast. COMPARISON:  None. FINDINGS: Lower chest: No acute abnormality. Hepatobiliary: Limited without IV contrast. No large focal hepatic abnormality or biliary obstruction pattern. Gallbladder nondistended. Common bile duct nondilated. Pancreas: Unremarkable. No pancreatic ductal dilatation or surrounding inflammatory changes. Spleen: Normal in size without focal abnormality. Adrenals/Urinary Tract: Normal adrenal glands. Right kidney and ureter demonstrate no acute obstruction, hydronephrosis, or hydroureter. Left kidney demonstrates no acute perinephric strandy edema/inflammation associated moderate left hydroureteronephrosis. This is secondary to a left distal ureteral calculus in the pelvis, image 67/3 measuring 5 mm. Urinary bladder collapsed. Stomach/Bowel: Stomach is within  normal limits. Appendix appears normal. No evidence of bowel wall thickening, distention, or inflammatory changes. Vascular/Lymphatic: Limited without IV contrast. Minor aortic bifurcation atherosclerosis. Negative for aneurysm. No retroperitoneal hemorrhage or hematoma. No bulky adenopathy. Reproductive: Uterus and adnexa normal in size. Uterus retroverted in position. No free fluid or fluid collection. Other: No abdominal wall hernia or abnormality. No abdominopelvic ascites. 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 Minor associated disc space narrowing. No acute osseous finding. IMPRESSION: 5 mm moderately obstructing left distal ureteral calculus in the pelvis with associated left hydroureteronephrosis. Electronically Signed   By: 08/20/2021.  Shick M.D.   On: 08/18/2021 09:48    EKG: not done   Labs on Admission: I have personally reviewed the available labs and imaging studies at the time of the admission.  Pertinent labs:   Na++ 131 Glucose 214 WBC 12.1 Platelets 405 UA: 150 glucose, large Hgb,  20 ketones, moderate LE, 30 protein, few bacteria, >50 WBC/RBC   Assessment/Plan Principal Problem:   Lower obstructive uropathy Active Problems:   Hypertension   Hyperlipidemia   Diabetes mellitus type 2 in nonobese (HCC)   ADD (attention deficit disorder)   * Lower obstructive uropathy -Patient without prior h/o stones presenting with LLQ/L flank pain -Imaging indicates a 5 mm moderately obstructing L distal ureteral calculus with L hydronephrosis -UA is also concerning for infection -Will admit to Med Surg at [facility] -Urology (Dr. Annabell Howells) has been consulted and is planning to place a stent later tonight -Will provide IVF hydration and pain control -Continue Rocephin for now  ADD (attention deficit disorder) -Hold Concerta - she will not be performing tasks that require attention and focus as an inpatient  Diabetes mellitus type 2 in nonobese (HCC) -Will check A1c -hold  Glucophage, Amaryl, Ozempic -Cover with moderate-scale SSI   Hyperlipidemia -Continue Crestor  Hypertension -Will cover with prn IV hydralazine for now -Hold HCTZ and lisinopril at this time       Note: This patient has been tested and is pending for the novel coronavirus COVID-19.   Level of care: Med-Surg DVT prophylaxis:  Lovenox  Code Status:  Full - confirmed with patient/family Family Communication: Husband was present throughout evaluation Disposition Plan:  The patient is from: home  Anticipated d/c is to: home without [facility] services   Anticipated d/c date will depend on clinical response to treatment, likely 2-3 days  Patient is currently: acutely ill Consults called: Urology  Admission status:  Admit - It is my clinical opinion that admission to INPATIENT is reasonable and necessary because of the expectation that this patient will require hospital care that crosses at least 2 midnights to treat this condition based on the medical complexity of the problems presented.  Given the aforementioned information, the predictability of an adverse outcome is felt to be significant.    Jonah Blue MD [facility]   How to contact the [facility] Attending or Consulting provider 7A - 7P or covering provider during after hours 7P -7A, for this patient?  Check the care team in [facility] and look for a) attending/consulting [facility] provider listed and b) the [facility] team listed Log into [email or web address] and use [facility]'s universal password to access. If you do not have the password, please contact the hospital operator. Locate the [facility] provider you are looking for under [facility] and page to a number that you can be directly reached. If you still have difficulty reaching the provider, please page the [facility] (Director on Call) for the Hospitalists listed on amion for assistance.   08/18/2021, 2:17 PM

## 2021-08-18 NOTE — ED Triage Notes (Signed)
Patient complains of left flank and groin pain that started two weeks ago and has gotten more intense. Patient denies changes in urination, reports family history of kidney stones. Patient states she has taken 3x vicodin 5-325 that she had from a dental procedure with minimal relief from pain.

## 2021-08-18 NOTE — Anesthesia Procedure Notes (Signed)
Procedure Name: LMA Insertion Date/Time: 08/18/2021 6:20 PM Performed by: Ezekiel Ina, CRNA Pre-anesthesia Checklist: Patient identified, Emergency Drugs available, Suction available and Patient being monitored Patient Re-evaluated:Patient Re-evaluated prior to induction Oxygen Delivery Method: Circle system utilized Preoxygenation: Pre-oxygenation with 100% oxygen Induction Type: IV induction Ventilation: Mask ventilation without difficulty LMA: LMA inserted LMA Size: 4.0 Number of attempts: 1 Tube secured with: Tape Dental Injury: Teeth and Oropharynx as per pre-operative assessment

## 2021-08-18 NOTE — Anesthesia Preprocedure Evaluation (Addendum)
Anesthesia Evaluation  Patient identified by MRN, date of birth, ID band  Reviewed: Allergy & Precautions, NPO status , Patient's Chart, lab work & pertinent test results  Airway Mallampati: II  TM Distance: >3 FB Neck ROM: Full    Dental no notable dental hx. (+) Teeth Intact, Dental Advisory Given   Pulmonary neg pulmonary ROS,    Pulmonary exam normal breath sounds clear to auscultation       Cardiovascular hypertension, Pt. on medications Normal cardiovascular exam Rhythm:Regular Rate:Normal     Neuro/Psych negative neurological ROS     GI/Hepatic negative GI ROS, Neg liver ROS,   Endo/Other  diabetes, Type 2  Renal/GU Renal diseaseLab Results      Component                Value               Date                      CREATININE               0.94                08/18/2021                BUN                      21 (H)              08/18/2021                NA                       131 (L)             08/18/2021                K                        3.7                 08/18/2021                CL                       97 (L)              08/18/2021                CO2                      24                  08/18/2021               Musculoskeletal negative musculoskeletal ROS (+)   Abdominal   Peds  Hematology negative hematology ROS (+) Lab Results      Component                Value               Date                      WBC                      12.1 (H)  08/18/2021                HGB                      13.6                08/18/2021                HCT                      39.7                08/18/2021                MCV                      90.4                08/18/2021                PLT                      405 (H)             08/18/2021              Anesthesia Other Findings   Reproductive/Obstetrics                            Anesthesia Physical Anesthesia  Plan  ASA: 3  Anesthesia Plan: General   Post-op Pain Management: Regional block and Dilaudid IV   Induction: Intravenous  PONV Risk Score and Plan: 4 or greater and Treatment may vary due to age or medical condition, Ondansetron, Midazolam and Dexamethasone  Airway Management Planned: LMA  Additional Equipment: None  Intra-op Plan:   Post-operative Plan:   Informed Consent: I have reviewed the patients History and Physical, chart, labs and discussed the procedure including the risks, benefits and alternatives for the proposed anesthesia with the patient or authorized representative who has indicated his/her understanding and acceptance.     Dental advisory given  Plan Discussed with: CRNA  Anesthesia Plan Comments:        Anesthesia Quick Evaluation

## 2021-08-18 NOTE — H&P (Signed)
Subjective: 1. Nephrolithiasis      Consult requested by Theodis Blaze PA.  Teresa Martin is a 60 yo with no prior history of stones but a very strong family history.   She has had some left sided discomfort for a couple of week but it became more severe over the past 24 hours and was associated with nausea.  She has had no hematuria or voiding symptoms.  A CT in the ER showed a 21mm left distal stone with obstruction.  She had a mild leukocytosis but a dirty appearing urine.  She has had no fever but it was felt that urgent stenting was indicated.  She was transferred to [facility] to [facility] and will be admitted to the hospitalist service after her procedure.  ROS:  Review of Systems  Constitutional:  Negative for chills and fever.  Gastrointestinal:  Positive for abdominal pain and nausea.  Genitourinary:  Positive for flank pain. Negative for dysuria and urgency.  All other systems reviewed and are negative.  No Known Allergies  Past Medical History:  Diagnosis Date   ADD (attention deficit disorder)    DM2 (diabetes mellitus, type 2) (HCC)    History of CT scan    Coronary artery calcium score 8/13: 0 Agatston units   Hyperlipidemia    Hypertension    Perimenopausal     Past Surgical History:  Procedure Laterality Date   KNEE SURGERY     Right Knee    Social History   Socioeconomic History   Marital status: Married    Spouse name: Not on file   Number of children: 3   Years of education: Not on file   Highest education level: Not on file  Occupational History   Occupation: middle school teacher    Comment: [place]  Tobacco Use   Smoking status: Never   Smokeless tobacco: Never  Vaping Use   Vaping Use: Never used  Substance and Sexual Activity   Alcohol use: Yes    Comment: occasional   Drug use: No   Sexual activity: Not on file  Other Topics Concern   Not on file  Social History Narrative   Not on file   Social Determinants of Health   Financial Resource Strain:  Not on file  Food Insecurity: Not on file  Transportation Needs: Not on file  Physical Activity: Not on file  Stress: Not on file  Social Connections: Not on file  Intimate Partner Violence: Not on file    Family History  Problem Relation Age of Onset   Hypertension Father    Heart attack Father 47   Heart attack Paternal Grandfather 59   Coronary artery disease Other    Diabetes Brother     Anti-infectives: Anti-infectives (From admission, onward)    Start     Dose/Rate Route Frequency Ordered Stop   08/19/21 1000  [MAR Hold]  cefTRIAXone (ROCEPHIN) 1 g in sodium chloride 0.9 % 100 mL IVPB        (MAR Hold since Mon 08/18/2021 at 1627.Hold Reason: Transfer to a Procedural area)   1 g 200 mL/hr over 30 Minutes Intravenous Every 24 hours 08/18/21 1424     08/18/21 1000  cefTRIAXone (ROCEPHIN) 1 g in sodium chloride 0.9 % 100 mL IVPB        1 g 200 mL/hr over 30 Minutes Intravenous  Once 08/18/21 0953 08/18/21 1102       Current Facility-Administered Medications  Medication Dose Route Frequency Provider Last Rate Last Admin   [  MAR Hold] acetaminophen (TYLENOL) tablet 650 mg  650 mg Oral Q6H PRN Karmen Bongo, MD       Or   Doug Sou Hold] acetaminophen (TYLENOL) suppository 650 mg  650 mg Rectal Q6H PRN Karmen Bongo, MD       [MAR Hold] albuterol (PROVENTIL) (2.5 MG/3ML) 0.083% nebulizer solution 2.5 mg  2.5 mg Nebulization Q2H PRN Karmen Bongo, MD       [facility] Sou Hold] bisacodyl (DULCOLAX) EC tablet 5 mg  5 mg Oral Daily PRN Karmen Bongo, MD       [facility] Hold] cefTRIAXone (ROCEPHIN) 1 g in sodium chloride 0.9 % 100 mL IVPB  1 g Intravenous Q24H Karmen Bongo, MD       Doug Sou Hold] docusate sodium (COLACE) capsule 100 mg  100 mg Oral BID Karmen Bongo, MD   100 mg at 08/18/21 1319   [MAR Hold] enoxaparin (LOVENOX) injection 40 mg  40 mg Subcutaneous Q24H Karmen Bongo, MD   40 mg at 08/18/21 1319   [MAR Hold] hydrALAZINE (APRESOLINE) injection 5 mg  5 mg Intravenous Q4H PRN  Karmen Bongo, MD       HYDROmorphone (DILAUDID) bolus via infusion 0.5 mg  0.5 mg Intravenous Q30 min PRN Irine Seal, MD       [facility] Hold] HYDROmorphone (DILAUDID) injection 0.5-1 mg  0.5-1 mg Intravenous Q2H PRN Karmen Bongo, MD       [facility] Hold] insulin aspart (novoLOG) injection 0-15 Units  0-15 Units Subcutaneous TID WC Karmen Bongo, MD       [facility] Hold] insulin aspart (novoLOG) injection 0-5 Units  0-5 Units Subcutaneous QHS Karmen Bongo, MD       lactated ringers infusion   Intravenous Continuous Karmen Bongo, MD   Paused at 08/18/21 1605   [MAR Hold] ondansetron (ZOFRAN) tablet 4 mg  4 mg Oral Q6H PRN Karmen Bongo, MD       Or   Doug Sou Hold] ondansetron [facility]) injection 4 mg  4 mg Intravenous Q6H PRN Karmen Bongo, MD   4 mg at 08/18/21 1317   [MAR Hold] oxyCODONE (Oxy IR/ROXICODONE) immediate release tablet 5 mg  5 mg Oral Q4H PRN Karmen Bongo, MD       [facility] Hold] polyethylene glycol (MIRALAX / GLYCOLAX) packet 17 g  17 g Oral Daily PRN Karmen Bongo, MD       [facility] Hold] rosuvastatin (CRESTOR) tablet 10 mg  10 mg Oral QHS Karmen Bongo, MD         Objective: Vital signs in last 24 hours: BP 120/75 (BP Location: Right Arm)   Pulse 70   Temp 98.4 F (36.9 C) (Oral)   Resp 12   SpO2 98%   Intake/Output from previous day: No intake/output data recorded. Intake/Output this shift: Total I/O In: 1100 [IV Piggyback:1100] Out: -    Physical Exam Vitals reviewed.  Constitutional:      Appearance: Normal appearance.  Cardiovascular:     Rate and Rhythm: Normal rate and regular rhythm.     Pulses: Normal pulses.  Pulmonary:     Effort: Pulmonary effort is normal. No respiratory distress.     Breath sounds: Normal breath sounds.  Abdominal:     General: Abdomen is flat.     Palpations: Abdomen is soft.     Tenderness: There is abdominal tenderness (suprapubic and LLQ). There is left CVA tenderness.  Musculoskeletal:        General: No swelling or  tenderness. Normal range of motion.  Skin:  General: Skin is warm and dry.  Neurological:     General: No focal deficit present.     Mental Status: She is alert and oriented to person, place, and time.  Psychiatric:        Mood and Affect: Mood normal.        Behavior: Behavior normal.    Lab Results:  Results for orders placed or performed during the hospital encounter of 08/18/21 (from the past 24 hour(s))  Urinalysis, Routine w reflex microscopic     Status: Abnormal   Collection Time: 08/18/21  8:26 AM  Result Value Ref Range   Color, Urine YELLOW YELLOW   APPearance CLOUDY (A) CLEAR   Specific Gravity, Urine 1.025 1.005 - 1.030   pH 5.0 5.0 - 8.0   Glucose, UA 150 (A) NEGATIVE mg/dL   Hgb urine dipstick LARGE (A) NEGATIVE   Bilirubin Urine NEGATIVE NEGATIVE   Ketones, ur 20 (A) NEGATIVE mg/dL   Protein, ur 30 (A) NEGATIVE mg/dL   Nitrite NEGATIVE NEGATIVE   Leukocytes,Ua MODERATE (A) NEGATIVE   RBC / HPF >50 (H) 0 - 5 RBC/hpf   WBC, UA >50 (H) 0 - 5 WBC/hpf   Bacteria, UA FEW (A) NONE SEEN   Squamous Epithelial / LPF 21-50 0 - 5   Trans Epithel, UA 2    Mucus PRESENT   Comprehensive metabolic panel     Status: Abnormal   Collection Time: 08/18/21  8:47 AM  Result Value Ref Range   Sodium 131 (L) 135 - 145 mmol/L   Potassium 3.7 3.5 - 5.1 mmol/L   Chloride 97 (L) 98 - 111 mmol/L   CO2 24 22 - 32 mmol/L   Glucose, Bld 214 (H) 70 - 99 mg/dL   BUN 21 (H) 6 - 20 mg/dL   Creatinine, Ser 7.25 0.44 - 1.00 mg/dL   Calcium 9.3 8.9 - 36.6 mg/dL   Total Protein 7.0 6.5 - 8.1 g/dL   Albumin 4.2 3.5 - 5.0 g/dL   AST 32 15 - 41 U/L   ALT 26 0 - 44 U/L   Alkaline Phosphatase 70 38 - 126 U/L   Total Bilirubin 0.7 0.3 - 1.2 mg/dL   GFR, Estimated >44 >03 mL/min   Anion gap 10 5 - 15  Lipase, blood     Status: None   Collection Time: 08/18/21  8:47 AM  Result Value Ref Range   Lipase 50 11 - 51 U/L  CBC with Differential     Status: Abnormal   Collection Time: 08/18/21   8:47 AM  Result Value Ref Range   WBC 12.1 (H) 4.0 - 10.5 K/uL   RBC 4.39 3.87 - 5.11 MIL/uL   Hemoglobin 13.6 12.0 - 15.0 g/dL   HCT 47.4 25.9 - 56.3 %   MCV 90.4 80.0 - 100.0 fL   MCH 31.0 26.0 - 34.0 pg   MCHC 34.3 30.0 - 36.0 g/dL   RDW 87.5 64.3 - 32.9 %   Platelets 405 (H) 150 - 400 K/uL   nRBC 0.0 0.0 - 0.2 %   Neutrophils Relative % 85 %   Neutro Abs 10.3 (H) 1.7 - 7.7 K/uL   Lymphocytes Relative 8 %   Lymphs Abs 0.9 0.7 - 4.0 K/uL   Monocytes Relative 6 %   Monocytes Absolute 0.8 0.1 - 1.0 K/uL   Eosinophils Relative 0 %   Eosinophils Absolute 0.0 0.0 - 0.5 K/uL   Basophils Relative 0 %   Basophils Absolute  0.0 0.0 - 0.1 K/uL   Immature Granulocytes 1 %   Abs Immature Granulocytes 0.08 (H) 0.00 - 0.07 K/uL  I-Stat beta hCG blood, ED     Status: None   Collection Time: 08/18/21  9:20 AM  Result Value Ref Range   I-stat hCG, quantitative <5.0 <5 mIU/mL   Comment 3          SARS Coronavirus 2 by RT PCR (hospital order, performed in [place] hospital lab) Nasopharyngeal Nasopharyngeal Swab     Status: None   Collection Time: 08/18/21  1:56 PM   Specimen: Nasopharyngeal Swab  Result Value Ref Range   SARS Coronavirus 2 NEGATIVE NEGATIVE    BMET Recent Labs    08/18/21 0847  NA 131*  K 3.7  CL 97*  CO2 24  GLUCOSE 214*  BUN 21*  CREATININE 0.94  CALCIUM 9.3   PT/INR No results for input(s): LABPROT, INR in the last 72 hours. ABG No results for input(s): PHART, HCO3 in the last 72 hours.  Invalid input(s): PCO2, PO2  Studies/Results: CT Renal Stone Study  Result Date: 08/18/2021 CLINICAL DATA:  Acute left flank and groin pain for 2 weeks, history of nephrolithiasis EXAM: CT ABDOMEN AND PELVIS WITHOUT CONTRAST TECHNIQUE: Multidetector CT imaging of the abdomen and pelvis was performed following the standard protocol without IV contrast. COMPARISON:  None. FINDINGS: Lower chest: No acute abnormality. Hepatobiliary: Limited without IV contrast. No large  focal hepatic abnormality or biliary obstruction pattern. Gallbladder nondistended. Common bile duct nondilated. Pancreas: Unremarkable. No pancreatic ductal dilatation or surrounding inflammatory changes. Spleen: Normal in size without focal abnormality. Adrenals/Urinary Tract: Normal adrenal glands. Right kidney and ureter demonstrate no acute obstruction, hydronephrosis, or hydroureter. Left kidney demonstrates no acute perinephric strandy edema/inflammation associated moderate left hydroureteronephrosis. This is secondary to a left distal ureteral calculus in the pelvis, image 67/3 measuring 5 mm. Urinary bladder collapsed. Stomach/Bowel: Stomach is within normal limits. Appendix appears normal. No evidence of bowel wall thickening, distention, or inflammatory changes. Vascular/Lymphatic: Limited without IV contrast. Minor aortic bifurcation atherosclerosis. Negative for aneurysm. No retroperitoneal hemorrhage or hematoma. No bulky adenopathy. Reproductive: Uterus and adnexa normal in size. Uterus retroverted in position. No free fluid or fluid collection. Other: No abdominal wall hernia or abnormality. No abdominopelvic ascites. 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 Minor associated disc space narrowing. No acute osseous finding. IMPRESSION: 5 mm moderately obstructing left distal ureteral calculus in the pelvis with associated left hydroureteronephrosis. Electronically Signed   By: Jerilynn Mages.  Shick M.D.   On: 08/18/2021 09:48    I have reviewed her labs and imaging and discuss the case with the EDP.   Assessment/Plan: Left distal stone with possible UTI:   I will take her to the OR for cystoscopy with left ureteral stent insertion.  I have reviewed the risks of bleeding, infection, ureteral injury, need for secondary procedures, thrombotic events and anesthetic complications.   I will also arrange ureteroscopy in 1-2 weeks to remove the stone.          No follow-ups on file.     CC: Theodis Blaze PA.     Irine Seal 08/18/[id number]

## 2021-08-18 NOTE — Transfer of Care (Signed)
Immediate Anesthesia Transfer of Care Note  Patient: Teresa Martin  Procedure(s) Performed: CYSTOSCOPY WITH STENT REPLACEMENT (Left)  Patient Location: PACU  Anesthesia Type:General  Level of Consciousness: drowsy  Airway & Oxygen Therapy: Patient Spontanous Breathing and Patient connected to nasal cannula oxygen  Post-op Assessment: Report given to RN and Post -op Vital signs reviewed and stable  Post vital signs: Reviewed and stable  Last Vitals:  Vitals Value Taken Time  BP 157/84 08/18/21 1850  Temp    Pulse 72 08/18/21 1852  Resp 8 08/18/21 1852  SpO2 97 % 08/18/21 1852  Vitals shown include unvalidated device data.  Last Pain:  Vitals:   08/18/21 1722  TempSrc: Oral  PainSc: 6          Complications: No notable events documented.

## 2021-08-18 NOTE — ED Notes (Signed)
Report given to Claretta Fraise, RN of Short Stay at [facility]

## 2021-08-18 NOTE — Op Note (Signed)
Procedure: 1.  Cystoscopy with left retrograde pyelogram and interpretation. 2.  Insertion of left double-J stent. 3.  Application of fluoroscopy.  Preop diagnosis: 5 mm obstructing left distal ureteral stone with possible infection.  Postop diagnosis: Same.  Surgeon: Dr. Bjorn Pippin.  Anesthesia: General.  Specimen: None.  Drain: 6 [place] by 24 cm left contour double-J stent.  EBL: None.  Complications: None.  Indications: The patient is a 60 year old female with no prior history of stones who presented to the emergency room with severe left flank pain.  She was found on CT to have a 5 mm left distal ureteral stone with obstruction.  The urine was suggestive of infection and she had a leukocytosis.  It was felt that urgent stenting was indicated.  Procedure: She was taken the operating room and a general anesthetic was induced.  She did receive Rocephin in the emergency room earlier today.  She was placed in lithotomy position and fitted with PAS hose.  Her perineum and genitalia were prepped with Betadine solution she was draped in usual sterile fashion.  Cystoscopy was performed using the 21 [place] scope and 30 degree lens.  Examination revealed some turbid urine with a little bit of blood subtle just adjacent to the left ureteral orifice.  The bladder wall was otherwise smooth and pale without tumors, stones or inflammation.  She did have descent of the bladder base with a cystocele.  The ureteral orifices were otherwise unremarkable.  The left ureteral orifice was cannulated with a 5 [place] open-ended catheter and contrast was instilled.  Left retrograde pyelogram revealed a normal caliber ureter up to the radiopaque calcification approximately 5 cm proximal to the meatus.  There was obstruction proximal to the stone which was flushed back into a slightly higher position.  A sensor wire was then passed to the kidney under fluoroscopic guidance and the open-ended catheter was  removed.  There was E flux of bloody turbid urine along with the wire.  A 6 French by 24 cm contour double-J stent without tether was then advanced the kidney under fluoroscopic guidance.  The wire was removed, leaving good coil in the kidney and a good coil in the bladder.  The bladder was drained and the cystoscope was removed.  She was taken down from lithotomy position, her anesthetic was reversed and she was moved recovery in stable condition.  There were no complications.

## 2021-08-18 NOTE — ED Notes (Signed)
This RN informed PA pt has been sleep and other pain medications were placed on hold. Pt in full sleep not appearing to be in any distress for past 30 minutes.

## 2021-08-18 NOTE — Assessment & Plan Note (Signed)
-  Patient without prior h/o stones presenting with LLQ/L flank pain -Imaging indicates a 5 mm moderately obstructing L distal ureteral calculus with L hydronephrosis -UA is also concerning for infection -Will admit to Med Surg at [facility] -Urology (Dr. Annabell Howells) has been consulted and is planning to place a stent later tonight -Will provide IVF hydration and pain control -Continue Rocephin for now

## 2021-08-18 NOTE — ED Notes (Signed)
Pt being transported to [facility] stay via [facility]. Pt is stable upon departure. Pt made aware of plan of care. Paperwork provided to [employer].

## 2021-08-18 NOTE — ED Provider Notes (Signed)
[facility] EMERGENCY DEPARTMENT Provider Note   CSN: [id number] Arrival date & time: 08/18/21  1027     History Chief Complaint  Patient presents with   Flank Pain    Teresa Martin is a 60 y.o. female.  With past medical history of type 2 diabetes, hyperlipidemia, hypertension who presents emergency department with left-sided abdominal pain.  States that since yesterday she began having left lower quadrant and left flank pain associated with nausea and vomiting.  She states that the pain was initially intermittent but increased in severity last night.  She describes the pain currently as sharp and steady.  She does note concentrated urine however denies dysuria, hematuria or decreased urine production. She states she had similar pain about 2 weeks ago but self abated. States she has significant family history of kidney stones. She denies fevers, diarrhea, vaginal discharge or bleeding, right sided abdominal pain, trauma to the abdomen or back.    Flank Pain Associated symptoms include abdominal pain. Pertinent negatives include no chest pain and no shortness of breath.      Past Medical History:  Diagnosis Date   ADD (attention deficit disorder)    DM2 (diabetes mellitus, type 2) (HCC)    History of CT scan    Coronary artery calcium score 8/13: 0 Agatston units   Hyperlipidemia    Hypertension    Perimenopausal     Patient Active Problem List   Diagnosis Date Noted   Hyperlipidemia 05/10/2013   Hypertension 04/27/2012   Cardiovascular risk factor 04/27/2012   Overweight(278.02) 04/27/2012    Past Surgical History:  Procedure Laterality Date   KNEE SURGERY     Right Knee     OB History   No obstetric history on file.     Family History  Problem Relation Age of Onset   Hypertension Father    Heart attack Father 79   Heart attack Paternal Grandfather 79   Coronary artery disease Other    Diabetes Brother     Social History   Tobacco Use    Smoking status: Never   Smokeless tobacco: Never  Vaping Use   Vaping Use: Never used  Substance Use Topics   Alcohol use: No   Drug use: No    Home Medications Prior to Admission medications   Medication Sig Start Date End Date Taking? Authorizing Provider  aspirin EC 81 MG tablet Take 1 tablet (81 mg total) by mouth daily. 04/26/12   Laurey Morale, MD  atorvastatin (LIPITOR) 10 MG tablet TAKE 1 TABLET (10 MG TOTAL) BY MOUTH DAILY AT 6 PM. 10/27/18   Gollan, Tollie Pizza, MD  CONCERTA 36 MG CR tablet Take 36 mg by mouth daily as needed (CONCENTRATION).  07/20/16   [provider]  glimepiride (AMARYL) 1 MG tablet TAKE 1 TABLET WITH BREAKFAST OR THE FIRST MAIN MEAL OF THE DAY. 06/15/16   [provider]  hydrochlorothiazide (HYDRODIURIL) 25 MG tablet TAKE 1 TABLET BY MOUTH EVERY DAY 11/29/18   End, Cristal Deer, MD  lisinopril (PRINIVIL,ZESTRIL) 20 MG tablet Take 1 tablet by mouth daily. 11/29/18   End, Cristal Deer, MD  metFORMIN (GLUCOPHAGE-XR) 500 MG 24 hr tablet Take 500 mg by mouth daily with breakfast. Take 2 in the a.m and 1 in the evening.    [provider]  Dola Argyle LANCETS 33G MISC  09/11/14   [provider]  Lum Babe test strip  09/12/14   [provider]  potassium chloride (K-DUR)  10 MEQ tablet TAKE 1 TABLET BY MOUTH DAILY. PLEASE KEEP UPCOMING APPT FOR FUTURE REFILLS. THANK YOU 12/27/18   End, Cristal Deerhristopher, MD    Allergies    Patient has no known allergies.  Review of Systems   Review of Systems  Constitutional:  Negative for fever.  Respiratory:  Negative for shortness of breath.   Cardiovascular:  Negative for chest pain.  Gastrointestinal:  Positive for abdominal pain, nausea and vomiting. Negative for diarrhea.  Genitourinary:  Positive for flank pain. Negative for decreased urine volume, difficulty urinating, dysuria and hematuria.  All other systems reviewed and are negative.  Physical Exam Updated Vital  Signs BP (!) 126/101   Pulse 74   Temp 98.3 F (36.8 C)   Resp 15   SpO2 99%   Physical Exam Vitals and nursing note reviewed.  Constitutional:      General: She is in acute distress.     Appearance: Normal appearance. She is not ill-appearing or toxic-appearing.     Comments: Vitals stable, patient writhing and groaning in bed   HENT:     Head: Normocephalic and atraumatic.     Mouth/Throat:     Mouth: Mucous membranes are dry.     Pharynx: Oropharynx is clear.  Eyes:     General: No scleral icterus.    Pupils: Pupils are equal, round, and reactive to light.  Cardiovascular:     Rate and Rhythm: Normal rate and regular rhythm.     Pulses: Normal pulses.     Heart sounds: No murmur heard. Pulmonary:     Effort: Pulmonary effort is normal. No respiratory distress.     Breath sounds: Normal breath sounds.  Abdominal:     General: Abdomen is protuberant. Bowel sounds are normal. There is no distension.     Palpations: Abdomen is soft.     Tenderness: There is abdominal tenderness in the left lower quadrant. There is left CVA tenderness and guarding. There is no right CVA tenderness or rebound. Negative signs include Murphy's sign and McBurney's sign.  Musculoskeletal:        General: Normal range of motion.     Cervical back: Normal range of motion.  Skin:    General: Skin is warm and dry.     Capillary Refill: Capillary refill takes less than 2 seconds.     Coloration: Skin is not jaundiced.  Neurological:     General: No focal deficit present.     Mental Status: She is alert and oriented to person, place, and time. Mental status is at baseline.  Psychiatric:        Mood and Affect: Mood normal.        Behavior: Behavior normal.    ED Results / Procedures / Treatments   Labs (all labs ordered are listed, but only abnormal results are displayed) Labs Reviewed  URINALYSIS, ROUTINE W REFLEX MICROSCOPIC - Abnormal; Notable for the following components:      Result Value    APPearance CLOUDY (*)    Glucose, UA 150 (*)    Hgb urine dipstick LARGE (*)    Ketones, ur 20 (*)    Protein, ur 30 (*)    Leukocytes,Ua MODERATE (*)    RBC / HPF >50 (*)    WBC, UA >50 (*)    Bacteria, UA FEW (*)    All other components within normal limits  COMPREHENSIVE METABOLIC PANEL - Abnormal; Notable for the following components:   Sodium 131 (*)  Chloride 97 (*)    Glucose, Bld 214 (*)    BUN 21 (*)    All other components within normal limits  CBC WITH DIFFERENTIAL/PLATELET - Abnormal; Notable for the following components:   WBC 12.1 (*)    Platelets 405 (*)    Neutro Abs 10.3 (*)    Abs Immature Granulocytes 0.08 (*)    All other components within normal limits  URINE CULTURE  LIPASE, BLOOD  I-STAT BETA HCG BLOOD, ED ([facility], [facility], [facility] ONLY)   EKG None  Radiology CT Renal Stone Study  Result Date: 08/18/2021 CLINICAL DATA:  Acute left flank and groin pain for 2 weeks, history of nephrolithiasis EXAM: CT ABDOMEN AND PELVIS WITHOUT CONTRAST TECHNIQUE: Multidetector CT imaging of the abdomen and pelvis was performed following the standard protocol without IV contrast. COMPARISON:  None. FINDINGS: Lower chest: No acute abnormality. Hepatobiliary: Limited without IV contrast. No large focal hepatic abnormality or biliary obstruction pattern. Gallbladder nondistended. Common bile duct nondilated. Pancreas: Unremarkable. No pancreatic ductal dilatation or surrounding inflammatory changes. Spleen: Normal in size without focal abnormality. Adrenals/Urinary Tract: Normal adrenal glands. Right kidney and ureter demonstrate no acute obstruction, hydronephrosis, or hydroureter. Left kidney demonstrates no acute perinephric strandy edema/inflammation associated moderate left hydroureteronephrosis. This is secondary to a left distal ureteral calculus in the pelvis, image 67/3 measuring 5 mm. Urinary bladder collapsed. Stomach/Bowel: Stomach is within normal limits. Appendix appears  normal. No evidence of bowel wall thickening, distention, or inflammatory changes. Vascular/Lymphatic: Limited without IV contrast. Minor aortic bifurcation atherosclerosis. Negative for aneurysm. No retroperitoneal hemorrhage or hematoma. No bulky adenopathy. Reproductive: Uterus and adnexa normal in size. Uterus retroverted in position. No free fluid or fluid collection. Other: No abdominal wall hernia or abnormality. No abdominopelvic ascites. 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 Minor associated disc space narrowing. No acute osseous finding. IMPRESSION: 5 mm moderately obstructing left distal ureteral calculus in the pelvis with associated left hydroureteronephrosis. Electronically Signed   By: Jerilynn Mages.  Shick M.D.   On: 08/18/2021 09:48    Procedures Procedures   Medications Ordered in ED Medications  ketorolac (TORADOL) 30 MG/ML injection 30 mg (has no administration in time range)  HYDROmorphone (DILAUDID) injection 1 mg (has no administration in time range)  sodium chloride 0.9 % bolus 1,000 mL (0 mLs Intravenous Stopped 08/18/21 1003)  morphine 4 MG/ML injection 4 mg (4 mg Intravenous Given 08/18/21 0904)  ondansetron (ZOFRAN) injection 4 mg (4 mg Intravenous Given 08/18/21 0904)  morphine 4 MG/ML injection 4 mg (4 mg Intravenous Given 08/18/21 1002)  cefTRIAXone (ROCEPHIN) 1 g in sodium chloride 0.9 % 100 mL IVPB (1 g Intravenous New Bag/Given 08/18/21 1005)    ED Course  I have reviewed the triage vital signs and the nursing notes.  Pertinent labs & imaging results that were available during my care of the patient were reviewed by me and considered in my medical decision making (see chart for details).    MDM Rules/Calculators/A&P 60 year old female who presents to the emergency department with left-sided abdominal and flank pain.  CT stone study shows 5 mm moderately obstructing left distal ureteral calculus in the pelvis associated with left  hydroureteronephrosis. No evidence of colitis.  Labs notable for leukocytosis to 12.1, BUN 21, creatinine 0.94 UA with large hemoglobin, protein, moderate leukocytes, greater than 50 white blood cells and few bacteria.  Consistent with likely UTI although it is moderately contaminated.  Given stone with evidence of UTI giving 1 g of Rocephin  No fever  Consulted urology, Dr. Jeffie Pollock @ 1121 who suggests admission to [facility] hospital by hospitalist service and stent placement. He does not suggest any further interventions at this time. Urine culture is pending and will continue with pain management.  1139: With Dr. Lorin Mercy, hospitalist who agrees to admit patient at this time.  Final Clinical Impression(s) / ED Diagnoses Final diagnoses:  Nephrolithiasis    Rx / DC Orders ED Discharge Orders     None        Mickie Hillier, PA-C 08/18/21 1139    Dorie Rank, MD 08/20/21 1816

## 2021-08-18 NOTE — H&P (View-Only) (Signed)
Subjective: 1. Nephrolithiasis      Consult requested by Theodis Blaze PA.  Teresa Martin is a 60 yo with no prior history of stones but a very strong family history.   She has had some left sided discomfort for a couple of week but it became more severe over the past 24 hours and was associated with nausea.  She has had no hematuria or voiding symptoms.  A CT in the ER showed a 64mm left distal stone with obstruction.  She had a mild leukocytosis but a dirty appearing urine.  She has had no fever but it was felt that urgent stenting was indicated.  She was transferred to [facility] to [facility] and will be admitted to the hospitalist service after her procedure.  ROS:  Review of Systems  Constitutional:  Negative for chills and fever.  Gastrointestinal:  Positive for abdominal pain and nausea.  Genitourinary:  Positive for flank pain. Negative for dysuria and urgency.  All other systems reviewed and are negative.  No Known Allergies  Past Medical History:  Diagnosis Date   ADD (attention deficit disorder)    DM2 (diabetes mellitus, type 2) (HCC)    History of CT scan    Coronary artery calcium score 8/13: 0 Agatston units   Hyperlipidemia    Hypertension    Perimenopausal     Past Surgical History:  Procedure Laterality Date   KNEE SURGERY     Right Knee    Social History   Socioeconomic History   Marital status: Married    Spouse name: Not on file   Number of children: 3   Years of education: Not on file   Highest education level: Not on file  Occupational History   Occupation: middle school teacher    Comment: [place]  Tobacco Use   Smoking status: Never   Smokeless tobacco: Never  Vaping Use   Vaping Use: Never used  Substance and Sexual Activity   Alcohol use: Yes    Comment: occasional   Drug use: No   Sexual activity: Not on file  Other Topics Concern   Not on file  Social History Narrative   Not on file   Social Determinants of Health   Financial Resource Strain:  Not on file  Food Insecurity: Not on file  Transportation Needs: Not on file  Physical Activity: Not on file  Stress: Not on file  Social Connections: Not on file  Intimate Partner Violence: Not on file    Family History  Problem Relation Age of Onset   Hypertension Father    Heart attack Father 24   Heart attack Paternal Grandfather 87   Coronary artery disease Other    Diabetes Brother     Anti-infectives: Anti-infectives (From admission, onward)    Start     Dose/Rate Route Frequency Ordered Stop   08/19/21 1000  [MAR Hold]  cefTRIAXone (ROCEPHIN) 1 g in sodium chloride 0.9 % 100 mL IVPB        (MAR Hold since Mon 08/18/2021 at 1627.Hold Reason: Transfer to a Procedural area)   1 g 200 mL/hr over 30 Minutes Intravenous Every 24 hours 08/18/21 1424     08/18/21 1000  cefTRIAXone (ROCEPHIN) 1 g in sodium chloride 0.9 % 100 mL IVPB        1 g 200 mL/hr over 30 Minutes Intravenous  Once 08/18/21 0953 08/18/21 1102       Current Facility-Administered Medications  Medication Dose Route Frequency Provider Last Rate Last Admin   [  MAR Hold] acetaminophen (TYLENOL) tablet 650 mg  650 mg Oral Q6H PRN Karmen Bongo, MD       Or   Doug Sou Hold] acetaminophen (TYLENOL) suppository 650 mg  650 mg Rectal Q6H PRN Karmen Bongo, MD       [MAR Hold] albuterol (PROVENTIL) (2.5 MG/3ML) 0.083% nebulizer solution 2.5 mg  2.5 mg Nebulization Q2H PRN Karmen Bongo, MD       [facility] Hold] bisacodyl (DULCOLAX) EC tablet 5 mg  5 mg Oral Daily PRN Karmen Bongo, MD       [facility] Hold] cefTRIAXone (ROCEPHIN) 1 g in sodium chloride 0.9 % 100 mL IVPB  1 g Intravenous Q24H Karmen Bongo, MD       [facility] Hold] docusate sodium (COLACE) capsule 100 mg  100 mg Oral BID Karmen Bongo, MD   100 mg at 08/18/21 1319   [MAR Hold] enoxaparin (LOVENOX) injection 40 mg  40 mg Subcutaneous Q24H Karmen Bongo, MD   40 mg at 08/18/21 1319   [MAR Hold] hydrALAZINE (APRESOLINE) injection 5 mg  5 mg Intravenous Q4H PRN  Karmen Bongo, MD       HYDROmorphone (DILAUDID) bolus via infusion 0.5 mg  0.5 mg Intravenous Q30 min PRN Irine Seal, MD       [facility] Hold] HYDROmorphone (DILAUDID) injection 0.5-1 mg  0.5-1 mg Intravenous Q2H PRN Karmen Bongo, MD       [facility] Hold] insulin aspart (novoLOG) injection 0-15 Units  0-15 Units Subcutaneous TID WC Karmen Bongo, MD       [facility] Hold] insulin aspart (novoLOG) injection 0-5 Units  0-5 Units Subcutaneous QHS Karmen Bongo, MD       lactated ringers infusion   Intravenous Continuous Karmen Bongo, MD   Paused at 08/18/21 1605   [MAR Hold] ondansetron (ZOFRAN) tablet 4 mg  4 mg Oral Q6H PRN Karmen Bongo, MD       Or   Doug Sou Hold] ondansetron [facility]) injection 4 mg  4 mg Intravenous Q6H PRN Karmen Bongo, MD   4 mg at 08/18/21 1317   [MAR Hold] oxyCODONE (Oxy IR/ROXICODONE) immediate release tablet 5 mg  5 mg Oral Q4H PRN Karmen Bongo, MD       [facility] Hold] polyethylene glycol (MIRALAX / GLYCOLAX) packet 17 g  17 g Oral Daily PRN Karmen Bongo, MD       [facility] Hold] rosuvastatin (CRESTOR) tablet 10 mg  10 mg Oral QHS Karmen Bongo, MD         Objective: Vital signs in last 24 hours: BP 120/75 (BP Location: Right Arm)   Pulse 70   Temp 98.4 F (36.9 C) (Oral)   Resp 12   SpO2 98%   Intake/Output from previous day: No intake/output data recorded. Intake/Output this shift: Total I/O In: 1100 [IV Piggyback:1100] Out: -    Physical Exam Vitals reviewed.  Constitutional:      Appearance: Normal appearance.  Cardiovascular:     Rate and Rhythm: Normal rate and regular rhythm.     Pulses: Normal pulses.  Pulmonary:     Effort: Pulmonary effort is normal. No respiratory distress.     Breath sounds: Normal breath sounds.  Abdominal:     General: Abdomen is flat.     Palpations: Abdomen is soft.     Tenderness: There is abdominal tenderness (suprapubic and LLQ). There is left CVA tenderness.  Musculoskeletal:        General: No swelling or  tenderness. Normal range of motion.  Skin:  General: Skin is warm and dry.  Neurological:     General: No focal deficit present.     Mental Status: She is alert and oriented to person, place, and time.  Psychiatric:        Mood and Affect: Mood normal.        Behavior: Behavior normal.    Lab Results:  Results for orders placed or performed during the hospital encounter of 08/18/21 (from the past 24 hour(s))  Urinalysis, Routine w reflex microscopic     Status: Abnormal   Collection Time: 08/18/21  8:26 AM  Result Value Ref Range   Color, Urine YELLOW YELLOW   APPearance CLOUDY (A) CLEAR   Specific Gravity, Urine 1.025 1.005 - 1.030   pH 5.0 5.0 - 8.0   Glucose, UA 150 (A) NEGATIVE mg/dL   Hgb urine dipstick LARGE (A) NEGATIVE   Bilirubin Urine NEGATIVE NEGATIVE   Ketones, ur 20 (A) NEGATIVE mg/dL   Protein, ur 30 (A) NEGATIVE mg/dL   Nitrite NEGATIVE NEGATIVE   Leukocytes,Ua MODERATE (A) NEGATIVE   RBC / HPF >50 (H) 0 - 5 RBC/hpf   WBC, UA >50 (H) 0 - 5 WBC/hpf   Bacteria, UA FEW (A) NONE SEEN   Squamous Epithelial / LPF 21-50 0 - 5   Trans Epithel, UA 2    Mucus PRESENT   Comprehensive metabolic panel     Status: Abnormal   Collection Time: 08/18/21  8:47 AM  Result Value Ref Range   Sodium 131 (L) 135 - 145 mmol/L   Potassium 3.7 3.5 - 5.1 mmol/L   Chloride 97 (L) 98 - 111 mmol/L   CO2 24 22 - 32 mmol/L   Glucose, Bld 214 (H) 70 - 99 mg/dL   BUN 21 (H) 6 - 20 mg/dL   Creatinine, Ser 7.25 0.44 - 1.00 mg/dL   Calcium 9.3 8.9 - 36.6 mg/dL   Total Protein 7.0 6.5 - 8.1 g/dL   Albumin 4.2 3.5 - 5.0 g/dL   AST 32 15 - 41 U/L   ALT 26 0 - 44 U/L   Alkaline Phosphatase 70 38 - 126 U/L   Total Bilirubin 0.7 0.3 - 1.2 mg/dL   GFR, Estimated >44 >03 mL/min   Anion gap 10 5 - 15  Lipase, blood     Status: None   Collection Time: 08/18/21  8:47 AM  Result Value Ref Range   Lipase 50 11 - 51 U/L  CBC with Differential     Status: Abnormal   Collection Time: 08/18/21   8:47 AM  Result Value Ref Range   WBC 12.1 (H) 4.0 - 10.5 K/uL   RBC 4.39 3.87 - 5.11 MIL/uL   Hemoglobin 13.6 12.0 - 15.0 g/dL   HCT 47.4 25.9 - 56.3 %   MCV 90.4 80.0 - 100.0 fL   MCH 31.0 26.0 - 34.0 pg   MCHC 34.3 30.0 - 36.0 g/dL   RDW 87.5 64.3 - 32.9 %   Platelets 405 (H) 150 - 400 K/uL   nRBC 0.0 0.0 - 0.2 %   Neutrophils Relative % 85 %   Neutro Abs 10.3 (H) 1.7 - 7.7 K/uL   Lymphocytes Relative 8 %   Lymphs Abs 0.9 0.7 - 4.0 K/uL   Monocytes Relative 6 %   Monocytes Absolute 0.8 0.1 - 1.0 K/uL   Eosinophils Relative 0 %   Eosinophils Absolute 0.0 0.0 - 0.5 K/uL   Basophils Relative 0 %   Basophils Absolute  0.0 0.0 - 0.1 K/uL   Immature Granulocytes 1 %   Abs Immature Granulocytes 0.08 (H) 0.00 - 0.07 K/uL  I-Stat beta hCG blood, ED     Status: None   Collection Time: 08/18/21  9:20 AM  Result Value Ref Range   I-stat hCG, quantitative <5.0 <5 mIU/mL   Comment 3          SARS Coronavirus 2 by RT PCR (hospital order, performed in [place] hospital lab) Nasopharyngeal Nasopharyngeal Swab     Status: None   Collection Time: 08/18/21  1:56 PM   Specimen: Nasopharyngeal Swab  Result Value Ref Range   SARS Coronavirus 2 NEGATIVE NEGATIVE    BMET Recent Labs    08/18/21 0847  NA 131*  K 3.7  CL 97*  CO2 24  GLUCOSE 214*  BUN 21*  CREATININE 0.94  CALCIUM 9.3   PT/INR No results for input(s): LABPROT, INR in the last 72 hours. ABG No results for input(s): PHART, HCO3 in the last 72 hours.  Invalid input(s): PCO2, PO2  Studies/Results: CT Renal Stone Study  Result Date: 08/18/2021 CLINICAL DATA:  Acute left flank and groin pain for 2 weeks, history of nephrolithiasis EXAM: CT ABDOMEN AND PELVIS WITHOUT CONTRAST TECHNIQUE: Multidetector CT imaging of the abdomen and pelvis was performed following the standard protocol without IV contrast. COMPARISON:  None. FINDINGS: Lower chest: No acute abnormality. Hepatobiliary: Limited without IV contrast. No large  focal hepatic abnormality or biliary obstruction pattern. Gallbladder nondistended. Common bile duct nondilated. Pancreas: Unremarkable. No pancreatic ductal dilatation or surrounding inflammatory changes. Spleen: Normal in size without focal abnormality. Adrenals/Urinary Tract: Normal adrenal glands. Right kidney and ureter demonstrate no acute obstruction, hydronephrosis, or hydroureter. Left kidney demonstrates no acute perinephric strandy edema/inflammation associated moderate left hydroureteronephrosis. This is secondary to a left distal ureteral calculus in the pelvis, image 67/3 measuring 5 mm. Urinary bladder collapsed. Stomach/Bowel: Stomach is within normal limits. Appendix appears normal. No evidence of bowel wall thickening, distention, or inflammatory changes. Vascular/Lymphatic: Limited without IV contrast. Minor aortic bifurcation atherosclerosis. Negative for aneurysm. No retroperitoneal hemorrhage or hematoma. No bulky adenopathy. Reproductive: Uterus and adnexa normal in size. Uterus retroverted in position. No free fluid or fluid collection. Other: No abdominal wall hernia or abnormality. No abdominopelvic ascites. 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 Minor associated disc space narrowing. No acute osseous finding. IMPRESSION: 5 mm moderately obstructing left distal ureteral calculus in the pelvis with associated left hydroureteronephrosis. Electronically Signed   By: Jerilynn Mages.  Shick M.D.   On: 08/18/2021 09:48    I have reviewed her labs and imaging and discuss the case with the EDP.   Assessment/Plan: Left distal stone with possible UTI:   I will take her to the OR for cystoscopy with left ureteral stent insertion.  I have reviewed the risks of bleeding, infection, ureteral injury, need for secondary procedures, thrombotic events and anesthetic complications.   I will also arrange ureteroscopy in 1-2 weeks to remove the stone.          No follow-ups on file.     CC: Theodis Blaze PA.     Irine Seal 08/18/2021 [phone]

## 2021-08-18 NOTE — Assessment & Plan Note (Signed)
Continue Crestor 

## 2021-08-18 NOTE — Assessment & Plan Note (Signed)
-  Hold Concerta - she will not be performing tasks that require attention and focus as an inpatient

## 2021-08-18 NOTE — Anesthesia Postprocedure Evaluation (Signed)
Anesthesia Post Note  Patient: Teresa Martin  Procedure(s) Performed: CYSTOSCOPY WITH STENT REPLACEMENT (Left)     Patient location during evaluation: PACU Anesthesia Type: General Level of consciousness: awake and alert Pain management: pain level controlled Vital Signs Assessment: post-procedure vital signs reviewed and stable Respiratory status: spontaneous breathing, nonlabored ventilation, respiratory function stable and patient connected to nasal cannula oxygen Cardiovascular status: blood pressure returned to baseline and stable Postop Assessment: no apparent nausea or vomiting Anesthetic complications: no   No notable events documented.  Last Vitals:  Vitals:   08/18/21 1807 08/18/21 1850  BP:  (!) 157/84  Pulse: 78 80  Resp:  10  Temp:  37.1 C  SpO2: 96% 95%    Last Pain:  Vitals:   08/18/21 1722  TempSrc: Oral  PainSc: 6                  Trevor Iha

## 2021-08-18 NOTE — Assessment & Plan Note (Signed)
-  Will cover with prn IV hydralazine for now -Hold HCTZ and lisinopril at this time

## 2021-08-18 NOTE — Assessment & Plan Note (Signed)
-  Will check A1c -hold Glucophage, Amaryl, Ozempic -Cover with moderate-scale SSI

## 2021-08-18 NOTE — ED Notes (Signed)
Patient transported to CT 

## 2021-08-19 ENCOUNTER — Encounter (HOSPITAL_COMMUNITY): Payer: Self-pay | Admitting: Urology

## 2021-08-19 ENCOUNTER — Other Ambulatory Visit: Payer: Self-pay | Admitting: Urology

## 2021-08-19 DIAGNOSIS — I10 Essential (primary) hypertension: Secondary | ICD-10-CM

## 2021-08-19 DIAGNOSIS — N139 Obstructive and reflux uropathy, unspecified: Secondary | ICD-10-CM | POA: Diagnosis not present

## 2021-08-19 DIAGNOSIS — E119 Type 2 diabetes mellitus without complications: Secondary | ICD-10-CM | POA: Diagnosis not present

## 2021-08-19 LAB — CBC
HCT: 38.6 % (ref 36.0–46.0)
Hemoglobin: 12.6 g/dL (ref 12.0–15.0)
MCH: 30.4 pg (ref 26.0–34.0)
MCHC: 32.6 g/dL (ref 30.0–36.0)
MCV: 93.2 fL (ref 80.0–100.0)
Platelets: 342 10*3/uL (ref 150–400)
RBC: 4.14 MIL/uL (ref 3.87–5.11)
RDW: 13.2 % (ref 11.5–15.5)
WBC: 8 10*3/uL (ref 4.0–10.5)
nRBC: 0 % (ref 0.0–0.2)

## 2021-08-19 LAB — GLUCOSE, CAPILLARY: Glucose-Capillary: 146 mg/dL — ABNORMAL HIGH (ref 70–99)

## 2021-08-19 LAB — BASIC METABOLIC PANEL
Anion gap: 7 (ref 5–15)
BUN: 13 mg/dL (ref 6–20)
CO2: 26 mmol/L (ref 22–32)
Calcium: 9.2 mg/dL (ref 8.9–10.3)
Chloride: 103 mmol/L (ref 98–111)
Creatinine, Ser: 0.73 mg/dL (ref 0.44–1.00)
GFR, Estimated: >60 mL/min (ref >60)
Glucose, Bld: 225 mg/dL — ABNORMAL HIGH (ref 70–99)
Potassium: 4 mmol/L (ref 3.5–5.1)
Sodium: 136 mmol/L (ref 135–145)

## 2021-08-19 LAB — URINE CULTURE: Culture: <10000 — AB

## 2021-08-19 LAB — HIV ANTIBODY (ROUTINE TESTING W REFLEX): HIV Screen 4th Generation wRfx: NONREACTIVE

## 2021-08-19 MED ORDER — SULFAMETHOXAZOLE-TRIMETHOPRIM 800-160 MG PO TABS: 1.0000 | ORAL_TABLET | Freq: Two times a day (BID) | ORAL | 0 refills | Status: DC

## 2021-08-19 MED ORDER — POLYETHYLENE GLYCOL 3350 17 G PO PACK: 17.0000 g | PACK | Freq: Every day | ORAL | 0 refills | Status: AC | PRN

## 2021-08-19 MED ORDER — OXYCODONE HCL 5 MG PO TABS: 5.0000 mg | ORAL_TABLET | ORAL | 0 refills | Status: AC | PRN

## 2021-08-19 NOTE — Progress Notes (Signed)
1 Day Post-Op  Subjective: Teresa Martin is doing very well s/p left ureteral stent placement.  She has no pain or fever.  She does have some hematuria but that is not unexpected.   ROS:  Review of Systems  All other systems reviewed and are negative.  Anti-infectives: Anti-infectives (From admission, onward)    Start     Dose/Rate Route Frequency Ordered Stop   08/19/21 1000  cefTRIAXone (ROCEPHIN) 1 g in sodium chloride 0.9 % 100 mL IVPB        1 g 200 mL/hr over 30 Minutes Intravenous Every 24 hours 08/18/21 1424     08/18/21 1000  cefTRIAXone (ROCEPHIN) 1 g in sodium chloride 0.9 % 100 mL IVPB        1 g 200 mL/hr over 30 Minutes Intravenous  Once 08/18/21 0953 08/18/21 1102       Current Facility-Administered Medications  Medication Dose Route Frequency Provider Last Rate Last Admin   acetaminophen (TYLENOL) tablet 650 mg  650 mg Oral Q6H PRN Jonah Blue, MD       Or   acetaminophen (TYLENOL) suppository 650 mg  650 mg Rectal Q6H PRN Jonah Blue, MD       albuterol (PROVENTIL) (2.5 MG/3ML) 0.083% nebulizer solution 2.5 mg  2.5 mg Nebulization Q2H PRN Jonah Blue, MD       bisacodyl (DULCOLAX) EC tablet 5 mg  5 mg Oral Daily PRN Jonah Blue, MD       cefTRIAXone (ROCEPHIN) 1 g in sodium chloride 0.9 % 100 mL IVPB  1 g Intravenous Q24H Jonah Blue, MD 200 mL/hr at 08/19/21 0836 1 g at 08/19/21 0836   docusate sodium (COLACE) capsule 100 mg  100 mg Oral BID Jonah Blue, MD   100 mg at 08/19/21 0828   enoxaparin (LOVENOX) injection 40 mg  40 mg Subcutaneous Q24H Jonah Blue, MD   40 mg at 08/18/21 1319   hydrALAZINE (APRESOLINE) injection 5 mg  5 mg Intravenous Q4H PRN Jonah Blue, MD       HYDROmorphone (DILAUDID) injection 0.5 mg  0.5 mg Intravenous Q30 min PRN Bjorn Pippin, MD       HYDROmorphone (DILAUDID) injection 0.5-1 mg  0.5-1 mg Intravenous Q2H PRN Jonah Blue, MD       insulin aspart (novoLOG) injection 0-15 Units  0-15 Units Subcutaneous TID WC  Jonah Blue, MD       insulin aspart (novoLOG) injection 0-5 Units  0-5 Units Subcutaneous QHS Jonah Blue, MD   3 Units at 08/18/21 2200   lactated ringers infusion   Intravenous Continuous Jonah Blue, MD 100 mL/hr at 08/19/21 0635 New Bag at 08/19/21 6433   lactated ringers infusion   Intravenous Continuous Trevor Iha, MD 10 mL/hr at 08/18/21 1704 New Bag at 08/18/21 1704   ondansetron (ZOFRAN) tablet 4 mg  4 mg Oral Q6H PRN Jonah Blue, MD       Or   ondansetron [facility]) injection 4 mg  4 mg Intravenous Q6H PRN Jonah Blue, MD   4 mg at 08/18/21 1317   oxyCODONE (Oxy IR/ROXICODONE) immediate release tablet 5 mg  5 mg Oral Q4H PRN Jonah Blue, MD       polyethylene glycol (MIRALAX / GLYCOLAX) packet 17 g  17 g Oral Daily PRN Jonah Blue, MD       rosuvastatin (CRESTOR) tablet 10 mg  10 mg Oral QHS Jonah Blue, MD   10 mg at 08/18/21 2200   sodium chloride irrigation 0.9 %  PRN Irine Seal, MD   3,000 mL at 08/18/21 1828     Objective: Vital signs in last 24 hours: Temp:  [98 F (36.7 C)-98.7 F (37.1 C)] 98.3 F (36.8 C) (11/29 0544) Pulse Rate:  [42-95] 64 (11/29 0544) Resp:  [3-23] 18 (11/29 0544) BP: (110-174)/(61-120) 128/76 (11/29 0544) SpO2:  [83 %-100 %] 95 % (11/29 0544) Weight:  [77.1 kg] 77.1 kg (11/28 1713)  Intake/Output from previous day: 11/28 0701 - 11/29 0700 In: 3950 [P.O.:650; I.V.:2200; IV Piggyback:1100] Out: [id number] [Urine:1775] Intake/Output this shift: Total I/O In: 240 [P.O.:240] Out: -    Physical Exam Vitals reviewed.  Constitutional:      Appearance: Normal appearance.  Neurological:     Mental Status: She is alert.    Lab Results:  Recent Labs    08/18/21 0847 08/19/21 0348  WBC 12.1* 8.0  HGB 13.6 12.6  HCT 39.7 38.6  PLT 405* 342   BMET Recent Labs    08/18/21 0847 08/19/21 0348  NA 131* 136  K 3.7 4.0  CL 97* 103  CO2 24 26  GLUCOSE 214* 225*  BUN 21* 13  CREATININE 0.94 0.73   CALCIUM 9.3 9.2   PT/INR No results for input(s): LABPROT, INR in the last 72 hours. ABG No results for input(s): PHART, HCO3 in the last 72 hours.  Invalid input(s): PCO2, PO2  Studies/Results: DG C-Arm 1-60 Min-No Report  Result Date: 08/18/2021 Fluoroscopy was utilized by the requesting physician.  No radiographic interpretation.   CT Renal Stone Study  Result Date: 08/18/2021 CLINICAL DATA:  Acute left flank and groin pain for 2 weeks, history of nephrolithiasis EXAM: CT ABDOMEN AND PELVIS WITHOUT CONTRAST TECHNIQUE: Multidetector CT imaging of the abdomen and pelvis was performed following the standard protocol without IV contrast. COMPARISON:  None. FINDINGS: Lower chest: No acute abnormality. Hepatobiliary: Limited without IV contrast. No large focal hepatic abnormality or biliary obstruction pattern. Gallbladder nondistended. Common bile duct nondilated. Pancreas: Unremarkable. No pancreatic ductal dilatation or surrounding inflammatory changes. Spleen: Normal in size without focal abnormality. Adrenals/Urinary Tract: Normal adrenal glands. Right kidney and ureter demonstrate no acute obstruction, hydronephrosis, or hydroureter. Left kidney demonstrates no acute perinephric strandy edema/inflammation associated moderate left hydroureteronephrosis. This is secondary to a left distal ureteral calculus in the pelvis, image 67/3 measuring 5 mm. Urinary bladder collapsed. Stomach/Bowel: Stomach is within normal limits. Appendix appears normal. No evidence of bowel wall thickening, distention, or inflammatory changes. Vascular/Lymphatic: Limited without IV contrast. Minor aortic bifurcation atherosclerosis. Negative for aneurysm. No retroperitoneal hemorrhage or hematoma. No bulky adenopathy. Reproductive: Uterus and adnexa normal in size. Uterus retroverted in position. No free fluid or fluid collection. Other: No abdominal wall hernia or abnormality. No abdominopelvic ascites.  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 Minor associated disc space narrowing. No acute osseous finding. IMPRESSION: 5 mm moderately obstructing left distal ureteral calculus in the pelvis with associated left hydroureteronephrosis. Electronically Signed   By: Jerilynn Mages.  Shick M.D.   On: 08/18/2021 09:48     Assessment and Plan: Left distal stone with possible UTI.  She is doing well s/p stenting without pain or fever.   She could be discharged home on oral antibiotic.   I will arrange ureteroscopy for stone removal in a week or so.       LOS: 1 day    Irine Seal 08/19/2021 [id number] Patient ID: Michele Mcalpine, female   DOB: [birth year], 60 y.o.   MRN: [id number]

## 2021-08-19 NOTE — Plan of Care (Signed)
  Problem: Pain Managment: Goal: General experience of comfort will improve Outcome: Progressing   Problem: Safety: Goal: Ability to remain free from injury will improve Outcome: Progressing   

## 2021-08-19 NOTE — Discharge Summary (Signed)
Physician Discharge Summary  Teresa Martin XLK:[id number] DOB: [birth year] DOA: 08/18/2021  PCP: Richardean Chimera, MD  Admit date: 08/18/2021 Discharge date: 08/19/2021  Admitted From: home Disposition:  Home  Recommendations for Outpatient Follow-up:  Follow up with PCP in 1-2 weeks Please obtain BMP/CBC in one week   Home Health:No Equipment/Devices:none  Discharge Condition:Stable CODE STATUS:Full Diet recommendation: Heart Healthy    Brief/Interim Summary:  60 y.o. female with medical history significant of DM; ADD; HTN; and HLD presenting with  groin/flank pain.  She reports of strong FH of nephrolithiasis but no prior history.  About 2 weeks ago, she developed periodic colicky pain of the LLQ/L flank.  Yesterday at church, the pain intensified.  It is now disabling.  She has mild pelvic fullness in addition to the discomfort and pelvic burning sensation without clear urinary symptoms.      Discharge Diagnoses:  Principal Problem:   Lower obstructive uropathy Active Problems:   Hypertension   Hyperlipidemia   Diabetes mellitus type 2 in nonobese (HCC)   ADD (attention deficit disorder)  Acute lower obstructive uropathy and possible UTI: Imaging showed 5 mm moderate obstructive left distal ureteral calculus with mild hydronephrosis, UA was concerning for infection was started empirically on IV Rocephin. Urine culture was inconclusive. Urology was consulted recommended transferring to [facility] stent placement. She is status post cystoscopy with left retrograde pyelogram and left double-J stent placement. Urine cultures were drawn and she remained afebrile leukocytosis is resolved. She was transitioned to oral Bactrim and she was given a short dose of narcotics which should continue as an outpatient. She will follow-up with urology in 1 week for ureteral stent removal.  A DD Patient's no changes were made.  Diabetes mellitus type 2 with obesity: Her oral hypoglycemic  agents were held on admission she was covered with sliding scale once she was tolerating her diet she was resumed on her home regimen.  Hyperlipidemia:  continue Crestor.  Essential hypertension: Antihypertensive medications were held on admission they will be resumed as an outpatient. Discharge Instructions  Discharge Instructions     Diet - low sodium heart healthy   Complete by: As directed    Increase activity slowly   Complete by: As directed       Allergies as of 08/19/2021   No Known Allergies      Medication List     TAKE these medications    aspirin EC 81 MG tablet Take 1 tablet (81 mg total) by mouth daily.   atorvastatin 10 MG tablet Commonly known as: LIPITOR TAKE 1 TABLET (10 MG TOTAL) BY MOUTH DAILY AT 6 PM.   Concerta 36 MG CR tablet Generic drug: methylphenidate Take 36 mg by mouth daily as needed (CONCENTRATION).   glimepiride 1 MG tablet Commonly known as: AMARYL Take 1 mg by mouth daily with breakfast.   hydrochlorothiazide 25 MG tablet Commonly known as: HYDRODIURIL TAKE 1 TABLET BY MOUTH EVERY DAY   ibuprofen 100 MG tablet Commonly known as: ADVIL Take 200 mg by mouth every 6 (six) hours as needed for pain.   lisinopril 20 MG tablet Commonly known as: ZESTRIL Take 1 tablet by mouth daily.   metFORMIN 500 MG 24 hr tablet Commonly known as: GLUCOPHAGE-XR Take 500 mg by mouth daily with breakfast. Take 2 in the a.m and 1 in the evening.   OneTouch Delica Lancets 33G Misc   OneTouch Verio test strip Generic drug: glucose blood   oxyCODONE 5 MG immediate release  tablet Commonly known as: Oxy IR/ROXICODONE Take 1 tablet (5 mg total) by mouth every 4 (four) hours as needed for up to 3 days for moderate pain.   Ozempic (0.25 or 0.5 MG/DOSE) 2 MG/1.5ML Sopn Generic drug: Semaglutide(0.25 or 0.5MG /DOS) Inject 0.25 mg into the skin once a week. Wednesday morning   polyethylene glycol 17 g packet Commonly known as: MIRALAX /  GLYCOLAX Take 17 g by mouth daily as needed for up to 3 days for mild constipation.   potassium chloride 10 MEQ tablet Commonly known as: KLOR-CON TAKE 1 TABLET BY MOUTH DAILY. PLEASE KEEP UPCOMING APPT FOR FUTURE REFILLS. THANK YOU What changed: See the new instructions.   rosuvastatin 10 MG tablet Commonly known as: CRESTOR Take 10 mg by mouth at bedtime.   sulfamethoxazole-trimethoprim 800-160 MG tablet Commonly known as: BACTRIM DS Take 1 tablet by mouth 2 (two) times daily.   VITAMIN D PO Take 1 tablet by mouth daily.        Follow-up Information     [facility] Follow up.   Why: My office will call to set up the Ureteroscopy. Contact information: [address] [place] [address] [phone]               No Known Allergies  Consultations: Urology   Procedures/Studies: DG C-Arm 1-60 Min-No Report  Result Date: 08/18/2021 Fluoroscopy was utilized by the requesting physician.  No radiographic interpretation.   CT Renal Stone Study  Result Date: 08/18/2021 CLINICAL DATA:  Acute left flank and groin pain for 2 weeks, history of nephrolithiasis EXAM: CT ABDOMEN AND PELVIS WITHOUT CONTRAST TECHNIQUE: Multidetector CT imaging of the abdomen and pelvis was performed following the standard protocol without IV contrast. COMPARISON:  None. FINDINGS: Lower chest: No acute abnormality. Hepatobiliary: Limited without IV contrast. No large focal hepatic abnormality or biliary obstruction pattern. Gallbladder nondistended. Common bile duct nondilated. Pancreas: Unremarkable. No pancreatic ductal dilatation or surrounding inflammatory changes. Spleen: Normal in size without focal abnormality. Adrenals/Urinary Tract: Normal adrenal glands. Right kidney and ureter demonstrate no acute obstruction, hydronephrosis, or hydroureter. Left kidney demonstrates no acute perinephric strandy edema/inflammation associated moderate left  hydroureteronephrosis. This is secondary to a left distal ureteral calculus in the pelvis, image 67/3 measuring 5 mm. Urinary bladder collapsed. Stomach/Bowel: Stomach is within normal limits. Appendix appears normal. No evidence of bowel wall thickening, distention, or inflammatory changes. Vascular/Lymphatic: Limited without IV contrast. Minor aortic bifurcation atherosclerosis. Negative for aneurysm. No retroperitoneal hemorrhage or hematoma. No bulky adenopathy. Reproductive: Uterus and adnexa normal in size. Uterus retroverted in position. No free fluid or fluid collection. Other: No abdominal wall hernia or abnormality. No abdominopelvic ascites. 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 Minor associated disc space narrowing. No acute osseous finding. IMPRESSION: 5 mm moderately obstructing left distal ureteral calculus in the pelvis with associated left hydroureteronephrosis. Electronically Signed   By: Judie Petit.  Shick M.D.   On: 08/18/2021 09:48     Subjective: Tolerating her diet feels great.  Discharge Exam: Vitals:   08/19/21 0010 08/19/21 0544  BP: 125/77 128/76  Pulse: 94 64  Resp: 20 18  Temp: 98.4 F (36.9 C) 98.3 F (36.8 C)  SpO2: 96% 95%   Vitals:   08/18/21 2042 08/18/21 2111 08/19/21 0010 08/19/21 0544  BP: 133/72 128/72 125/77 128/76  Pulse: 77 81 94 64  Resp: 14 20 20 18   Temp: 98.2 F (36.8 C) 98.1 F (36.7 C) 98.4 F (36.9 C)  98.3 F (36.8 C)  TempSrc: Oral Oral Oral Oral  SpO2: 100% 96% 96% 95%  Weight:      Height:        General: Pt is alert, awake, not in acute distress Cardiovascular: RRR, S1/S2 +, no rubs, no gallops Respiratory: CTA bilaterally, no wheezing, no rhonchi Abdominal: Soft, NT, ND, bowel sounds + Extremities: no edema, no cyanosis    The results of significant diagnostics from this hospitalization (including imaging, microbiology, ancillary and laboratory) are listed below for reference.     Microbiology: Recent  Results (from the past 240 hour(s))  Urine Culture     Status: Abnormal   Collection Time: 08/18/21  9:37 AM   Specimen: Urine, Clean Catch  Result Value Ref Range Status   Specimen Description URINE, CLEAN CATCH  Final   Special Requests NONE  Final   Culture (A)  Final    <10,000 COLONIES/mL INSIGNIFICANT GROWTH Performed at [facility], [address] [place], [place] [address]    Report Status 08/19/2021 FINAL  Final  SARS Coronavirus 2 by RT PCR (hospital order, performed in [facility] hospital lab) Nasopharyngeal Nasopharyngeal Swab     Status: None   Collection Time: 08/18/21  1:56 PM   Specimen: Nasopharyngeal Swab  Result Value Ref Range Status   SARS Coronavirus 2 NEGATIVE NEGATIVE Final    Comment: SARS-CoV-2 target nucleic acids are NOT DETECTED. The SARS-CoV-2 RNA is generally detectable in upper and lower respiratory specimens during the acute phase of infection. The lowest concentration of SARS-CoV-2 viral copies this assay can detect is 250 copies / mL. A negative result does not preclude SARS-CoV-2 infection and should not be used as the sole basis for treatment or other patient management decisions. A negative result may occur with improper specimen collection / handling, submission of specimen other than nasopharyngeal swab, presence of viral mutation(s) within the areas targeted by this assay, and inadequate number of viral copies (<250 copies /  mL). A negative result must be combined with clinical observations, patient history, and epidemiological information. Performed at [facility], [address] [place], [place] [address]      Labs: BNP (last 3 results) No results for input(s): BNP in the last 8760 hours. Basic Metabolic Panel: Recent Labs  Lab 08/18/21 0847 08/19/21 0348  NA 131* 136  K 3.7 4.0  CL 97* 103  CO2 24 26  GLUCOSE 214* 225*  BUN 21* 13  CREATININE 0.94 0.73  CALCIUM 9.3 9.2   Liver Function Tests: Recent Labs  Lab  08/18/21 0847  AST 32  ALT 26  ALKPHOS 70  BILITOT 0.7  PROT 7.0  ALBUMIN 4.2   Recent Labs  Lab 08/18/21 0847  LIPASE 50   No results for input(s): AMMONIA in the last 168 hours. CBC: Recent Labs  Lab 08/18/21 0847 08/19/21 0348  WBC 12.1* 8.0  NEUTROABS 10.3*  --   HGB 13.6 12.6  HCT 39.7 38.6  MCV 90.4 93.2  PLT 405* 342   Cardiac Enzymes: No results for input(s): CKTOTAL, CKMB, CKMBINDEX, TROPONINI in the last 168 hours. BNP: Invalid input(s): POCBNP CBG: Recent Labs  Lab 08/18/21 1709 08/18/21 1904 08/18/21 2144 08/19/21 0756  GLUCAP 139* 135* 271* 146*   D-Dimer No results for input(s): DDIMER in the last 72 hours. Hgb A1c No results for input(s): HGBA1C in the last 72 hours. Lipid Profile No results for input(s): CHOL, HDL, LDLCALC, TRIG, CHOLHDL, LDLDIRECT in the last  72 hours. Thyroid function studies No results for input(s): TSH, T4TOTAL, T3FREE, THYROIDAB in the last 72 hours.  Invalid input(s): FREET3 Anemia work up No results for input(s): VITAMINB12, FOLATE, FERRITIN, TIBC, IRON, RETICCTPCT in the last 72 hours. Urinalysis    Component Value Date/Time   COLORURINE YELLOW 08/18/2021 0826   APPEARANCEUR CLOUDY (A) 08/18/2021 0826   LABSPEC 1.025 08/18/2021 0826   PHURINE 5.0 08/18/2021 0826   GLUCOSEU 150 (A) 08/18/2021 0826   HGBUR LARGE (A) 08/18/2021 0826   BILIRUBINUR NEGATIVE 08/18/2021 0826   KETONESUR 20 (A) 08/18/2021 0826   PROTEINUR 30 (A) 08/18/2021 0826   NITRITE NEGATIVE 08/18/2021 0826   LEUKOCYTESUR MODERATE (A) 08/18/2021 0826   Sepsis Labs Invalid input(s): PROCALCITONIN,  WBC,  LACTICIDVEN Microbiology Recent Results (from the past 240 hour(s))  Urine Culture     Status: Abnormal   Collection Time: 08/18/21  9:37 AM   Specimen: Urine, Clean Catch  Result Value Ref Range Status   Specimen Description URINE, CLEAN CATCH  Final   Special Requests NONE  Final   Culture (A)  Final    <10,000 COLONIES/mL  INSIGNIFICANT GROWTH Performed at [facility], [address] [place], [place] [address]    Report Status 08/19/2021 FINAL  Final  SARS Coronavirus 2 by RT PCR (hospital order, performed in [facility] hospital lab) Nasopharyngeal Nasopharyngeal Swab     Status: None   Collection Time: 08/18/21  1:56 PM   Specimen: Nasopharyngeal Swab  Result Value Ref Range Status   SARS Coronavirus 2 NEGATIVE NEGATIVE Final    Comment: SARS-CoV-2 target nucleic acids are NOT DETECTED. The SARS-CoV-2 RNA is generally detectable in upper and lower respiratory specimens during the acute phase of infection. The lowest concentration of SARS-CoV-2 viral copies this assay can detect is 250 copies / mL. A negative result does not preclude SARS-CoV-2 infection and should not be used as the sole basis for treatment or other patient management decisions. A negative result may occur with improper specimen collection / handling, submission of specimen other than nasopharyngeal swab, presence of viral mutation(s) within the areas targeted by this assay, and inadequate number of viral copies (<250 copies /  mL). A negative result must be combined with clinical observations, patient history, and epidemiological information. Performed at [facility], [address], [place], [place] [address]     SIGNED:   Marinda Elk, MD  [facility] 08/19/2021, 10:09 AM Pager   If 7PM-7AM, please contact night-coverage [email or web address] Password [email or web address]

## 2021-08-19 NOTE — Progress Notes (Signed)
Discharge package printed and instructions given to patient. Patient verbalizes understanding. 

## 2021-08-20 LAB — HEMOGLOBIN A1C
Hgb A1c MFr Bld: 7.5 % — ABNORMAL HIGH (ref 4.8–5.6)
Mean Plasma Glucose: 169 mg/dL

## 2021-08-28 ENCOUNTER — Encounter (HOSPITAL_BASED_OUTPATIENT_CLINIC_OR_DEPARTMENT_OTHER): Payer: Self-pay | Admitting: Urology

## 2021-09-01 ENCOUNTER — Other Ambulatory Visit: Payer: Self-pay

## 2021-09-01 ENCOUNTER — Encounter (HOSPITAL_BASED_OUTPATIENT_CLINIC_OR_DEPARTMENT_OTHER): Payer: Self-pay | Admitting: Urology

## 2021-09-01 NOTE — Progress Notes (Signed)
Spoke w/ via phone for pre-op interview--- pt Lab needs dos----   ekg            Lab results------ pt has current lab work done 08-19-2021 CBC/ BMP results in epic COVID test -----patient states asymptomatic no test needed Arrive at ------- 0530 on 09-02-2021 NPO after MN NO Solid Food.  Clear liquids from MN until--- 0430 Med rec completed Medications to take morning of surgery ----- none Diabetic medication ----- do not take metformin morning of surgery Patient instructed no nail polish to be worn day of surgery Patient instructed to bring photo id and insurance card day of surgery Patient aware to have Driver (ride ) / caregiver for 24 hours after surgery --husband, Teresa Martin Patient Special Instructions ----- n/a Pre-Op special Istructions ----- n/a Patient verbalized understanding of instructions that were given at this phone interview. Patient denies shortness of breath, chest pain, fever, cough at this phone interview.

## 2021-09-01 NOTE — Anesthesia Preprocedure Evaluation (Addendum)
Anesthesia Evaluation  Patient identified by MRN, date of birth, ID band Patient awake    Reviewed: Allergy & Precautions, NPO status , Patient's Chart, lab work & pertinent test results  Airway Mallampati: II  TM Distance: >3 FB Neck ROM: Full    Dental no notable dental hx. (+) Teeth Intact, Dental Advisory Given   Pulmonary neg pulmonary ROS,    Pulmonary exam normal breath sounds clear to auscultation       Cardiovascular hypertension, Pt. on medications Normal cardiovascular exam Rhythm:Regular Rate:Normal     Neuro/Psych negative neurological ROS  negative psych ROS   GI/Hepatic negative GI ROS, Neg liver ROS,   Endo/Other  diabetes, Poorly Controlled, Type 2, Oral Hypoglycemic AgentsMedication has been switched recently and her morning fasting FS has been high- 175 this AM  Renal/GU L ureteral stone   negative genitourinary   Musculoskeletal negative musculoskeletal ROS (+)   Abdominal   Peds  Hematology negative hematology ROS (+) hct 38.6   Anesthesia Other Findings   Reproductive/Obstetrics negative OB ROS                            Anesthesia Physical Anesthesia Plan  ASA: 2  Anesthesia Plan: General   Post-op Pain Management: Tylenol PO (pre-op) and Toradol IV (intra-op)   Induction: Intravenous  PONV Risk Score and Plan: 4 or greater and Ondansetron, Dexamethasone, Midazolam and Treatment may vary due to age or medical condition  Airway Management Planned: LMA  Additional Equipment: None  Intra-op Plan:   Post-operative Plan: Extubation in OR  Informed Consent: I have reviewed the patients History and Physical, chart, labs and discussed the procedure including the risks, benefits and alternatives for the proposed anesthesia with the patient or authorized representative who has indicated his/her understanding and acceptance.     Dental advisory given  Plan  Discussed with: CRNA  Anesthesia Plan Comments:        Anesthesia Quick Evaluation

## 2021-09-02 ENCOUNTER — Ambulatory Visit (HOSPITAL_BASED_OUTPATIENT_CLINIC_OR_DEPARTMENT_OTHER)
Admission: RE | Admit: 2021-09-02 | Discharge: 2021-09-02 | Disposition: A | Discharge status: Home / Self Care | Payer: BC Managed Care – PPO | Source: Ambulatory Visit | Attending: Urology | Admitting: Urology

## 2021-09-02 ENCOUNTER — Encounter (HOSPITAL_BASED_OUTPATIENT_CLINIC_OR_DEPARTMENT_OTHER): Payer: Self-pay | Admitting: Urology

## 2021-09-02 ENCOUNTER — Ambulatory Visit (HOSPITAL_BASED_OUTPATIENT_CLINIC_OR_DEPARTMENT_OTHER): Payer: BC Managed Care – PPO | Admitting: Anesthesiology

## 2021-09-02 ENCOUNTER — Other Ambulatory Visit: Payer: Self-pay

## 2021-09-02 ENCOUNTER — Encounter (HOSPITAL_BASED_OUTPATIENT_CLINIC_OR_DEPARTMENT_OTHER)
Admission: RE | Disposition: A | Discharge status: Home / Self Care | Payer: Self-pay | Source: Ambulatory Visit | Attending: Urology

## 2021-09-02 DIAGNOSIS — I10 Essential (primary) hypertension: Secondary | ICD-10-CM | POA: Insufficient documentation

## 2021-09-02 DIAGNOSIS — Z79899 Other long term (current) drug therapy: Secondary | ICD-10-CM | POA: Insufficient documentation

## 2021-09-02 DIAGNOSIS — Z7984 Long term (current) use of oral hypoglycemic drugs: Secondary | ICD-10-CM | POA: Diagnosis not present

## 2021-09-02 DIAGNOSIS — N201 Calculus of ureter: Secondary | ICD-10-CM | POA: Diagnosis present

## 2021-09-02 DIAGNOSIS — E1165 Type 2 diabetes mellitus with hyperglycemia: Secondary | ICD-10-CM | POA: Diagnosis not present

## 2021-09-02 HISTORY — PX: CYSTOSCOPY/URETEROSCOPY/HOLMIUM LASER/STENT PLACEMENT: SHX6546

## 2021-09-02 HISTORY — DX: Calculus of ureter: N20.1

## 2021-09-02 HISTORY — DX: Presence of spectacles and contact lenses: Z97.3

## 2021-09-02 HISTORY — DX: Nontoxic single thyroid nodule: E04.1

## 2021-09-02 HISTORY — DX: Personal history of colonic polyps: Z86.010

## 2021-09-02 HISTORY — DX: Personal history of adenomatous and serrated colon polyps: Z86.0101

## 2021-09-02 LAB — GLUCOSE, CAPILLARY
Glucose-Capillary: 175 mg/dL — ABNORMAL HIGH (ref 70–99)
Glucose-Capillary: 147 mg/dL — ABNORMAL HIGH (ref 70–99)

## 2021-09-02 SURGERY — CYSTOSCOPY/URETEROSCOPY/HOLMIUM LASER/STENT PLACEMENT
Anesthesia: General | Site: Renal | Laterality: Left

## 2021-09-02 MED ORDER — OXYCODONE HCL 5 MG PO TABS: 5.0000 mg | ORAL_TABLET | Freq: Once | ORAL | Status: DC | PRN

## 2021-09-02 MED ORDER — HYDROMORPHONE HCL 1 MG/ML IJ SOLN
0.2500 mg | INTRAMUSCULAR | Status: DC | PRN
Start: 2021-09-02 — End: 2021-09-02

## 2021-09-02 MED ORDER — PROPOFOL 10 MG/ML IV BOLUS
INTRAVENOUS | Status: DC | PRN
  Administered 2021-09-02: 160 mg via INTRAVENOUS

## 2021-09-02 MED ORDER — ONDANSETRON HCL 4 MG/2ML IJ SOLN: 4.0000 mg | Freq: Once | INTRAMUSCULAR | Status: DC | PRN

## 2021-09-02 MED ORDER — ACETAMINOPHEN 500 MG PO TABS
ORAL_TABLET | ORAL | Status: AC
  Filled 2021-09-02: qty 2

## 2021-09-02 MED ORDER — LIDOCAINE 2% (20 MG/ML) 5 ML SYRINGE
INTRAMUSCULAR | Status: AC
  Filled 2021-09-02: qty 5

## 2021-09-02 MED ORDER — OXYCODONE-ACETAMINOPHEN 5-325 MG PO TABS: 1.0000 | ORAL_TABLET | Freq: Four times a day (QID) | ORAL | 0 refills | Status: AC | PRN

## 2021-09-02 MED ORDER — SODIUM CHLORIDE 0.9 % IV SOLN: 250.0000 mL | INTRAVENOUS | Status: DC | PRN

## 2021-09-02 MED ORDER — ONDANSETRON HCL 4 MG/2ML IJ SOLN
INTRAMUSCULAR | Status: DC | PRN
  Administered 2021-09-02: 4 mg via INTRAVENOUS

## 2021-09-02 MED ORDER — SODIUM CHLORIDE 0.9 % IV SOLN
INTRAVENOUS | Status: AC
  Filled 2021-09-02: qty 100

## 2021-09-02 MED ORDER — KETOROLAC TROMETHAMINE 30 MG/ML IJ SOLN: 30.0000 mg | Freq: Once | INTRAMUSCULAR | Status: DC | PRN

## 2021-09-02 MED ORDER — AMISULPRIDE (ANTIEMETIC) 5 MG/2ML IV SOLN: 10.0000 mg | Freq: Once | INTRAVENOUS | Status: DC | PRN

## 2021-09-02 MED ORDER — LIDOCAINE 2% (20 MG/ML) 5 ML SYRINGE
INTRAMUSCULAR | Status: DC | PRN
  Administered 2021-09-02: 80 mg via INTRAVENOUS

## 2021-09-02 MED ORDER — SODIUM CHLORIDE 0.9 % IR SOLN
Status: DC | PRN
  Administered 2021-09-02: 1000 mL

## 2021-09-02 MED ORDER — CEFTRIAXONE SODIUM 2 G IJ SOLR
INTRAMUSCULAR | Status: AC
  Filled 2021-09-02: qty 20

## 2021-09-02 MED ORDER — PHENYLEPHRINE 40 MCG/ML (10ML) SYRINGE FOR IV PUSH (FOR BLOOD PRESSURE SUPPORT)
PREFILLED_SYRINGE | INTRAVENOUS | Status: AC
  Filled 2021-09-02: qty 10

## 2021-09-02 MED ORDER — ACETAMINOPHEN 325 MG PO TABS: 650.0000 mg | ORAL_TABLET | ORAL | Status: DC | PRN

## 2021-09-02 MED ORDER — PROPOFOL 500 MG/50ML IV EMUL
INTRAVENOUS | Status: AC
  Filled 2021-09-02: qty 50

## 2021-09-02 MED ORDER — SODIUM CHLORIDE 0.9% FLUSH: 3.0000 mL | INTRAVENOUS | Status: DC | PRN

## 2021-09-02 MED ORDER — DEXAMETHASONE SODIUM PHOSPHATE 10 MG/ML IJ SOLN
INTRAMUSCULAR | Status: AC
  Filled 2021-09-02: qty 1

## 2021-09-02 MED ORDER — MORPHINE SULFATE (PF) 4 MG/ML IV SOLN: 2.0000 mg | INTRAVENOUS | Status: DC | PRN

## 2021-09-02 MED ORDER — SODIUM CHLORIDE 0.9% FLUSH: 3.0000 mL | Freq: Two times a day (BID) | INTRAVENOUS | Status: DC

## 2021-09-02 MED ORDER — FENTANYL CITRATE (PF) 100 MCG/2ML IJ SOLN
INTRAMUSCULAR | Status: AC
  Filled 2021-09-02: qty 2

## 2021-09-02 MED ORDER — KETOROLAC TROMETHAMINE 30 MG/ML IJ SOLN
INTRAMUSCULAR | Status: DC | PRN
  Administered 2021-09-02: 30 mg via INTRAVENOUS

## 2021-09-02 MED ORDER — SODIUM CHLORIDE 0.9 % IV SOLN
INTRAVENOUS | Status: DC
  Administered 2021-09-02: 1000 mL via INTRAVENOUS

## 2021-09-02 MED ORDER — MIDAZOLAM HCL 2 MG/2ML IJ SOLN
INTRAMUSCULAR | Status: AC
  Filled 2021-09-02: qty 2

## 2021-09-02 MED ORDER — MIDAZOLAM HCL 5 MG/5ML IJ SOLN
INTRAMUSCULAR | Status: DC | PRN
  Administered 2021-09-02: 2 mg via INTRAVENOUS

## 2021-09-02 MED ORDER — DEXAMETHASONE SODIUM PHOSPHATE 10 MG/ML IJ SOLN
INTRAMUSCULAR | Status: DC | PRN
  Administered 2021-09-02: 4 mg via INTRAVENOUS

## 2021-09-02 MED ORDER — OXYCODONE HCL 5 MG PO TABS: 5.0000 mg | ORAL_TABLET | ORAL | Status: DC | PRN

## 2021-09-02 MED ORDER — ACETAMINOPHEN 500 MG PO TABS
1000.0000 mg | ORAL_TABLET | Freq: Once | ORAL | Status: AC
  Administered 2021-09-02: 1000 mg via ORAL

## 2021-09-02 MED ORDER — PHENYLEPHRINE 40 MCG/ML (10ML) SYRINGE FOR IV PUSH (FOR BLOOD PRESSURE SUPPORT)
PREFILLED_SYRINGE | INTRAVENOUS | Status: DC | PRN
  Administered 2021-09-02: 80 ug via INTRAVENOUS

## 2021-09-02 MED ORDER — FENTANYL CITRATE (PF) 100 MCG/2ML IJ SOLN
INTRAMUSCULAR | Status: DC | PRN
  Administered 2021-09-02: 25 ug via INTRAVENOUS

## 2021-09-02 MED ORDER — ONDANSETRON HCL 4 MG/2ML IJ SOLN
INTRAMUSCULAR | Status: AC
  Filled 2021-09-02: qty 2

## 2021-09-02 MED ORDER — SODIUM CHLORIDE 0.9 % IV SOLN
2.0000 g | INTRAVENOUS | Status: AC
  Administered 2021-09-02: 2 g via INTRAVENOUS

## 2021-09-02 MED ORDER — ACETAMINOPHEN 325 MG RE SUPP: 650.0000 mg | RECTAL | Status: DC | PRN

## 2021-09-02 MED ORDER — OXYCODONE HCL 5 MG/5ML PO SOLN: 5.0000 mg | Freq: Once | ORAL | Status: DC | PRN

## 2021-09-02 MED ORDER — 0.9 % SODIUM CHLORIDE (POUR BTL) OPTIME
TOPICAL | Status: DC | PRN
  Administered 2021-09-02: 500 mL

## 2021-09-02 SURGICAL SUPPLY — 27 items
BAG DRAIN URO-CYSTO SKYTR STRL (DRAIN) ×4 IMPLANT
BAG DRN UROCATH (DRAIN) ×1
BASKET STONE 1.7 NGAGE (UROLOGICAL SUPPLIES) ×2 IMPLANT
BASKET ZERO TIP NITINOL 2.4FR (BASKET) IMPLANT
BSKT STON RTRVL ZERO TP 2.4FR (BASKET)
CATH URET 5FR 28IN CONE TIP (BALLOONS)
CATH URET 5FR 28IN OPEN ENDED (CATHETERS) IMPLANT
CATH URET 5FR 70CM CONE TIP (BALLOONS) IMPLANT
CLOTH BEACON ORANGE TIMEOUT ST (SAFETY) ×2 IMPLANT
ELECT REM PT RETURN 9FT ADLT (ELECTROSURGICAL)
ELECTRODE REM PT RTRN 9FT ADLT (ELECTROSURGICAL) IMPLANT
FIBER LASER FLEXIVA 365 (UROLOGICAL SUPPLIES) IMPLANT
GLOVE SURG POLYISO LF SZ8 (GLOVE) ×4 IMPLANT
GOWN STRL REUS W/TWL XL LVL3 (GOWN DISPOSABLE) ×4 IMPLANT
GUIDEWIRE ANG ZIPWIRE 038X150 (WIRE) IMPLANT
GUIDEWIRE STR DUAL SENSOR (WIRE) ×4 IMPLANT
INFUSOR MANOMETER BAG 3000ML (MISCELLANEOUS) IMPLANT
IV NS IRRIG 3000ML ARTHROMATIC (IV SOLUTION) ×4 IMPLANT
KIT TURNOVER CYSTO (KITS) ×4 IMPLANT
MANIFOLD NEPTUNE II (INSTRUMENTS) ×4 IMPLANT
NS IRRIG 500ML POUR BTL (IV SOLUTION) ×4 IMPLANT
PACK CYSTO (CUSTOM PROCEDURE TRAY) ×4 IMPLANT
TRACTIP FLEXIVA PULS ID 200XHI (Laser) IMPLANT
TRACTIP FLEXIVA PULSE ID 200 (Laser)
TUBE CONNECTING 12'X1/4 (SUCTIONS) ×1
TUBE CONNECTING 12X1/4 (SUCTIONS) ×3 IMPLANT
TUBING UROLOGY SET (TUBING) ×2 IMPLANT

## 2021-09-02 NOTE — Anesthesia Procedure Notes (Signed)
Procedure Name: LMA Insertion Date/Time: 09/02/2021 7:40 AM Performed by: Pearson Grippe, CRNA Pre-anesthesia Checklist: Patient identified, Emergency Drugs available, Suction available and Patient being monitored Patient Re-evaluated:Patient Re-evaluated prior to induction Oxygen Delivery Method: Circle system utilized Preoxygenation: Pre-oxygenation with 100% oxygen Induction Type: IV induction Ventilation: Mask ventilation without difficulty LMA: LMA inserted LMA Size: 4.0 Number of attempts: 1 Airway Equipment and Method: Bite block Placement Confirmation: positive ETCO2 Tube secured with: Tape Dental Injury: Teeth and Oropharynx as per pre-operative assessment

## 2021-09-02 NOTE — Op Note (Signed)
Procedure: 1.  Cystoscopy with removal of left double-J stent. 2.  Left ureteroscopy with stone extraction. 3.  Application of fluoroscopy.  Preop diagnosis: Left distal ureteral stone.  Postop diagnosis: Same.  Surgeon: Dr. Bjorn Pippin.  Anesthesia: General.  Specimen: Stone.  Drain: None.  EBL: None.  Complications: None.  Indications: The patient is a 60 year old female who originally underwent placement of a left ureteral stent for a 5 mm distal stone with possible infection.  She returns now for definitive stone management.  Procedure: She was taken operating room where she was given Rocephin.  A general anesthetic was induced.  She was placed in lithotomy position and fitted with PAS hose.  Her perineum and genitalia were prepped with Betadine solution she was draped in usual sterile fashion.  Cystoscopy was performed using a 21 [place] scope and 30 degree lens.  Examination demonstrated a normal urethra.  There was a stent at the left ureter with some.  Stent edema of the orifice.  There was mild erythema diffusely in the bladder from stent irritation.  The right ureteral orifice was unremarkable.  The stent was grasped with a grasping forceps and removed to the urethral meatus.  A sensor wire was then advanced the kidney under fluoroscopic guidance and the stent was removed.  The 6.5 French semirigid ureteroscope was then advanced alongside the wire and the stone was visualized in the upper distal ureter.  The stone was grasped with an engage basket and removed intact without difficulty.  Reinspection of the ureter after stone removal revealed no mucosal trauma or other indications for stent replacement.  The wire was removed and the bladder was drained.  She was taken down from lithotomy position, her anesthetic was reversed and she was moved recovery in stable condition.  There were no complications.  She will be sent home with a stone to bring to the office for follow-up and  analysis.

## 2021-09-02 NOTE — Interval H&P Note (Signed)
History and Physical Interval Note:  Teresa Martin returns today for ureteroscopy for stone removal.   09/02/2021 7:14 AM  Ruffin Pyo  has presented today for surgery, with the diagnosis of LEFT URETERAL STONE.  The various methods of treatment have been discussed with the patient and family. After consideration of risks, benefits and other options for treatment, the patient has consented to  Procedure(s): CYSTOSCOPY LEFT URETEROSCOPY/HOLMIUM LASER/STENT PLACEMENT (Left) as a surgical intervention.  The patient's history has been reviewed, patient examined, no change in status, stable for surgery.  I have reviewed the patient's chart and labs.  Questions were answered to the patient's satisfaction.     Bjorn Pippin

## 2021-09-02 NOTE — Transfer of Care (Signed)
Immediate Anesthesia Transfer of Care Note  Patient: Teresa Martin  Procedure(s) Performed: CYSTOSCOPY LEFT URETEROSCOPY//STENT REMOVAL (Left: Renal)  Patient Location: PACU  Anesthesia Type:General  Level of Consciousness: awake, alert  and oriented  Airway & Oxygen Therapy: Patient Spontanous Breathing and Patient connected to face mask oxygen  Post-op Assessment: Report given to RN and Post -op Vital signs reviewed and stable  Post vital signs: Reviewed and stable  Last Vitals:  Vitals Value Taken Time  BP 136/85 09/02/21 0802  Temp    Pulse 72 09/02/21 0803  Resp 10 09/02/21 0803  SpO2 100 % 09/02/21 0803  Vitals shown include unvalidated device data.  Last Pain:  Vitals:   09/02/21 0612  TempSrc: Oral  PainSc: 0-No pain      Patients Stated Pain Goal: 4 (09/02/21 0612)  Complications: No notable events documented.

## 2021-09-02 NOTE — Anesthesia Postprocedure Evaluation (Signed)
Anesthesia Post Note  Patient: Teresa Martin  Procedure(s) Performed: CYSTOSCOPY LEFT URETEROSCOPY//STENT REMOVAL (Left: Renal)     Patient location during evaluation: PACU Anesthesia Type: General Level of consciousness: awake and alert, oriented and patient cooperative Pain management: pain level controlled Vital Signs Assessment: post-procedure vital signs reviewed and stable Respiratory status: spontaneous breathing, nonlabored ventilation and respiratory function stable Cardiovascular status: blood pressure returned to baseline and stable Postop Assessment: no apparent nausea or vomiting Anesthetic complications: no   No notable events documented.  Last Vitals:  Vitals:   09/02/21 0612 09/02/21 0802  BP: 136/77 136/85  Pulse: 80 70  Resp: 17 16  Temp: 37.3 C (!) 36.2 C  SpO2: 99% 100%    Last Pain:  Vitals:   09/02/21 0802  TempSrc:   PainSc: Asleep                 Lannie Fields

## 2021-09-02 NOTE — Discharge Instructions (Addendum)
CYSTOSCOPY HOME CARE INSTRUCTIONS  Activity: Rest for the remainder of the day.  Do not drive or operate equipment today.  You may resume normal activities in one to two days as instructed by your physician.   Meals: Drink plenty of liquids and eat light foods such as gelatin or soup this evening.  You may return to a normal meal plan tomorrow.  Return to Work: You may return to work in one to two days or as instructed by your physician.  Special Instructions / Symptoms: Call your physician if any of these symptoms occur:   -persistent or heavy bleeding  -bleeding which continues after first few urination  -large blood clots that are difficult to pass  -urine stream diminishes or stops completely  -fever equal to or higher than 101 degrees Farenheit.  -cloudy urine with a strong, foul odor  -severe pain  Females should always wipe from front to back after elimination.  You may feel some burning pain when you urinate.  This should disappear with time.  Applying moist heat to the lower abdomen or a hot tub bath may help relieve the pain. \  Please bring your stone to the office for analysis.   No ibuprofen, Advil, Aleve, Motrin, ketorolac, meloxicam, naproxen, or other NSAIDS until after 2 pm today if needed. No acetaminophen/tylenol until after 12:15 pm today if needed.   Post Anesthesia Home Care Instructions  Activity: Get plenty of rest for the remainder of the day. A responsible individual must stay with you for 24 hours following the procedure.  For the next 24 hours, DO NOT: -Drive a car -Advertising copywriter -Drink alcoholic beverages -Take any medication unless instructed by your physician -Make any legal decisions or sign important papers.  Meals: Start with liquid foods such as gelatin or soup. Progress to regular foods as tolerated. Avoid greasy, spicy, heavy foods. If nausea and/or vomiting occur, drink only clear liquids until the nausea and/or vomiting subsides.  Call your physician if vomiting continues.  Special Instructions/Symptoms: Your throat may feel dry or sore from the anesthesia or the breathing tube placed in your throat during surgery. If this causes discomfort, gargle with warm salt water. The discomfort should disappear within 24 hours.  If you had a scopolamine patch placed behind your ear for the management of post- operative nausea and/or vomiting:  1. The medication in the patch is effective for 72 hours, after which it should be removed.  Wrap patch in a tissue and discard in the trash. Wash hands thoroughly with soap and water. 2. You may remove the patch earlier than 72 hours if you experience unpleasant side effects which may include dry mouth, dizziness or visual disturbances. 3. Avoid touching the patch. Wash your hands with soap and water after contact with the patch.

## 2021-09-03 ENCOUNTER — Encounter (HOSPITAL_BASED_OUTPATIENT_CLINIC_OR_DEPARTMENT_OTHER): Payer: Self-pay | Admitting: Urology

## 2021-09-05 ENCOUNTER — Encounter (HOSPITAL_COMMUNITY): Payer: Self-pay | Admitting: Urology

## 2022-04-24 ENCOUNTER — Other Ambulatory Visit: Payer: Self-pay | Admitting: Family Medicine

## 2022-04-24 DIAGNOSIS — Z780 Asymptomatic menopausal state: Secondary | ICD-10-CM

## 2022-04-24 DIAGNOSIS — Z1231 Encounter for screening mammogram for malignant neoplasm of breast: Secondary | ICD-10-CM

## 2022-04-24 DIAGNOSIS — E559 Vitamin D deficiency, unspecified: Secondary | ICD-10-CM

## 2022-10-16 ENCOUNTER — Ambulatory Visit: Payer: BC Managed Care – PPO

## 2022-10-16 ENCOUNTER — Inpatient Hospital Stay: Admission: RE | Admit: 2022-10-16 | Payer: BC Managed Care – PPO | Source: Ambulatory Visit

## 2022-12-04 ENCOUNTER — Other Ambulatory Visit: Payer: BC Managed Care – PPO

## 2022-12-07 ENCOUNTER — Ambulatory Visit
Admission: RE | Admit: 2022-12-07 | Discharge: 2022-12-07 | Disposition: A | Discharge status: Home / Self Care | Payer: BC Managed Care – PPO | Source: Ambulatory Visit | Attending: Family Medicine | Admitting: Family Medicine

## 2022-12-07 DIAGNOSIS — Z780 Asymptomatic menopausal state: Secondary | ICD-10-CM

## 2022-12-07 DIAGNOSIS — E559 Vitamin D deficiency, unspecified: Secondary | ICD-10-CM

## 2022-12-18 ENCOUNTER — Other Ambulatory Visit: Payer: BC Managed Care – PPO

## 2023-02-03 ENCOUNTER — Other Ambulatory Visit: Payer: Self-pay | Admitting: Family Medicine

## 2023-02-03 DIAGNOSIS — Z8249 Family history of ischemic heart disease and other diseases of the circulatory system: Secondary | ICD-10-CM

## 2023-03-08 ENCOUNTER — Ambulatory Visit
Admission: RE | Admit: 2023-03-08 | Discharge: 2023-03-08 | Disposition: A | Discharge status: Home / Self Care | Payer: No Typology Code available for payment source | Source: Ambulatory Visit | Attending: Family Medicine | Admitting: Family Medicine

## 2023-03-08 DIAGNOSIS — Z8249 Family history of ischemic heart disease and other diseases of the circulatory system: Secondary | ICD-10-CM

## 2023-05-29 IMAGING — CT CT RENAL STONE PROTOCOL
2 of 4 series · 16 of 46 positions shown, 18 images · non-contrast
Comparison: None.

CLINICAL DATA: Acute left flank and groin pain for 2 weeks, history
of nephrolithiasis

EXAM:
CT ABDOMEN AND PELVIS WITHOUT CONTRAST
TECHNIQUE: Multidetector CT imaging of the abdomen and pelvis was performed
following the standard protocol without IV contrast.

[Series 3: stone study 5.0 i30f 2 · axial · 0.90mm/px · z∈[+599,+994]mm · 13 of 87 slices shown, 15 images]
[im 4/87  soft-tissue]
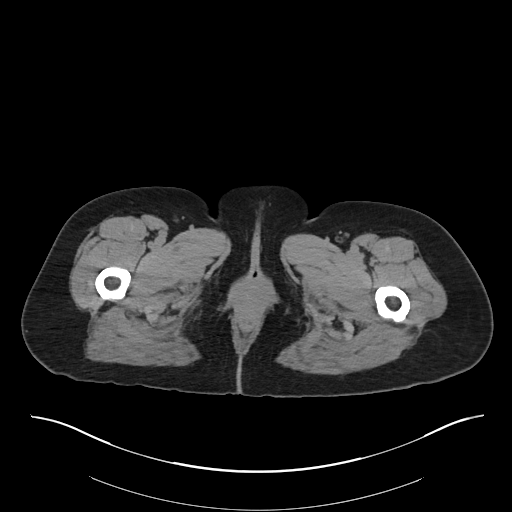
[im 4/87  bone]
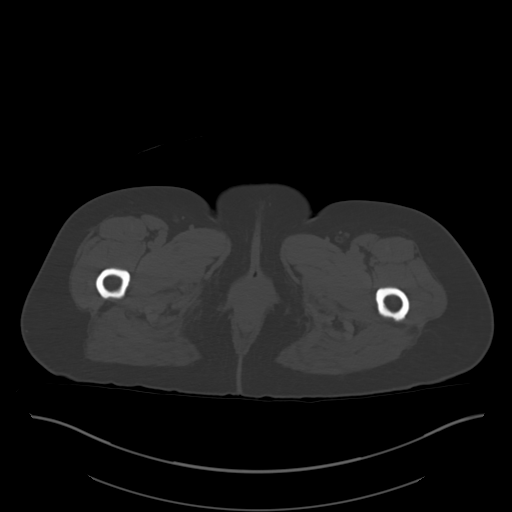
[im 11/87  soft-tissue]
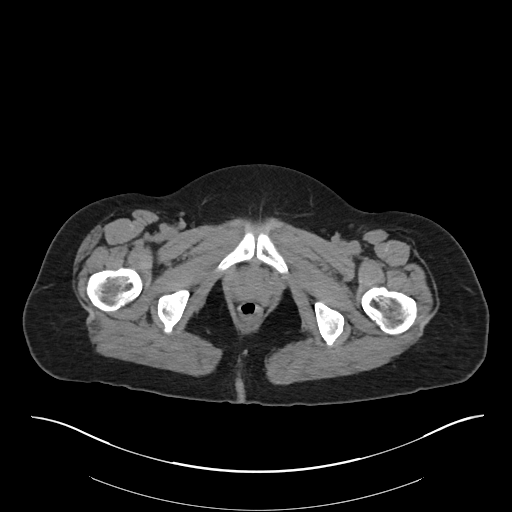
[im 18/87  soft-tissue]
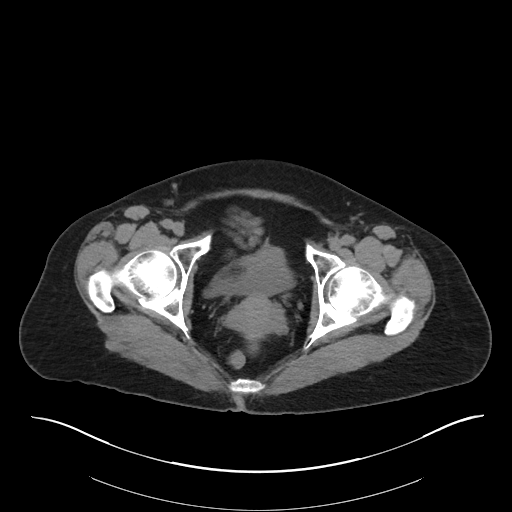
[im 25/87  soft-tissue]
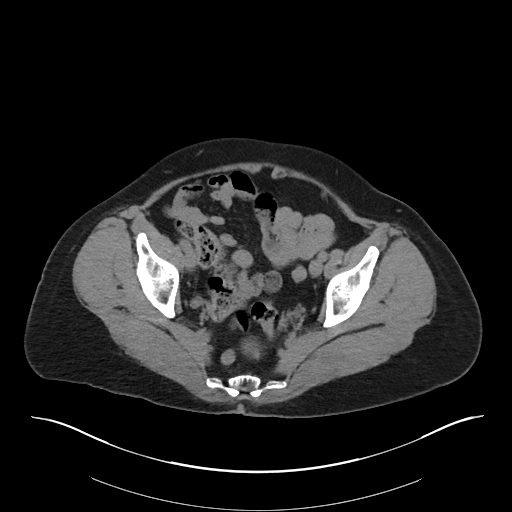
[im 31/87  soft-tissue]
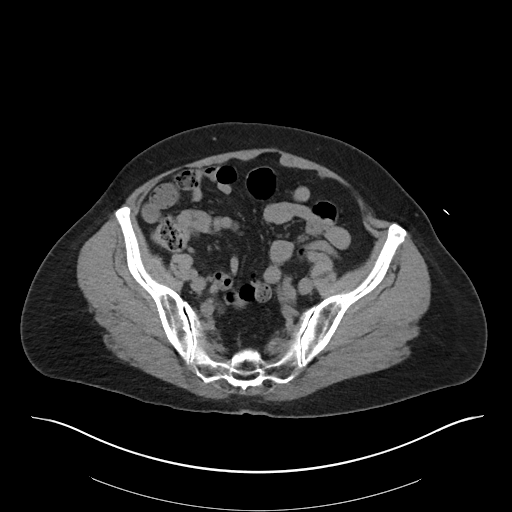
[im 38/87  soft-tissue]
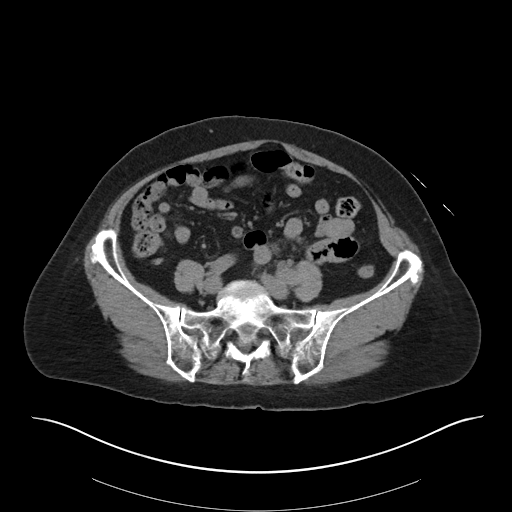
[im 45/87  soft-tissue]
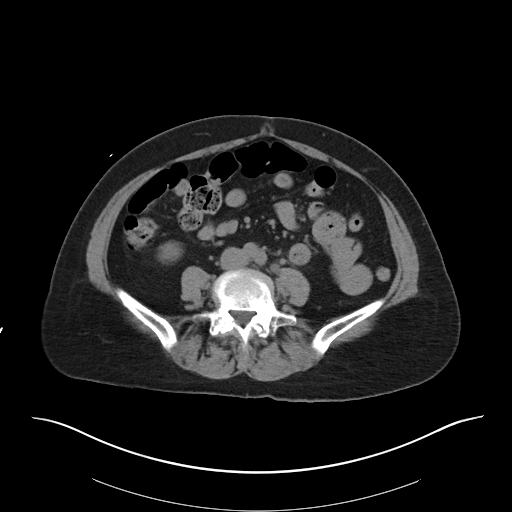
[im 49/87  soft-tissue]
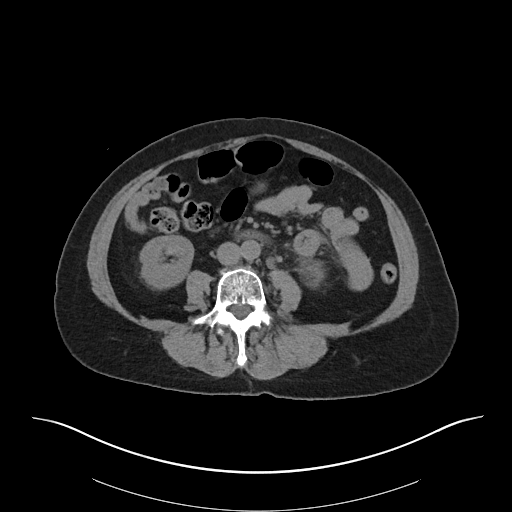
[im 56/87  soft-tissue]
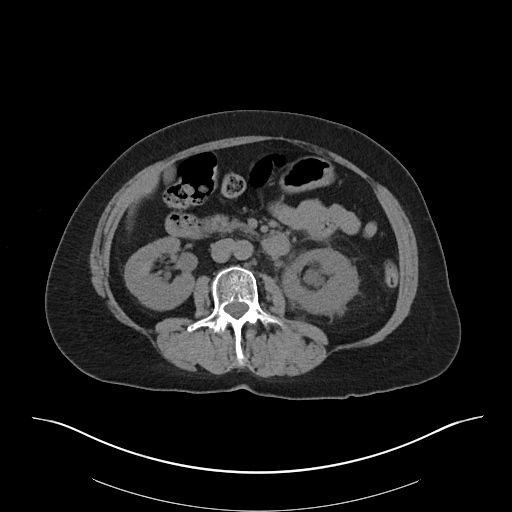
[im 56/87  bone]
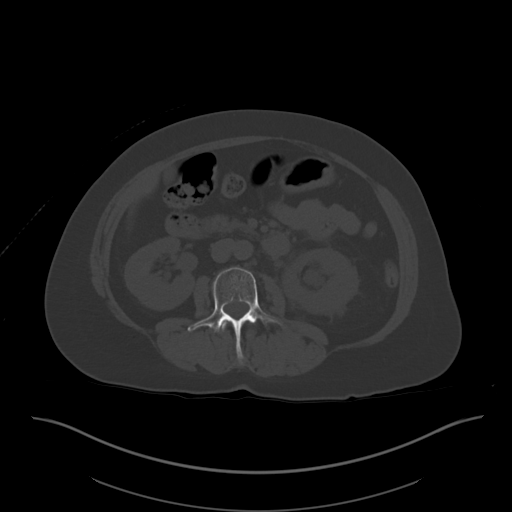
[im 62/87  soft-tissue]
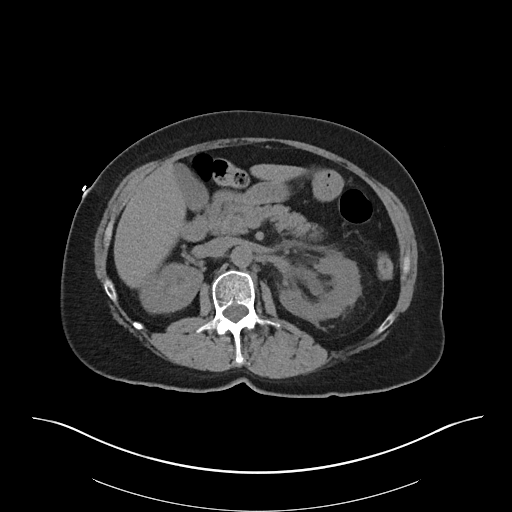
[im 69/87  soft-tissue]
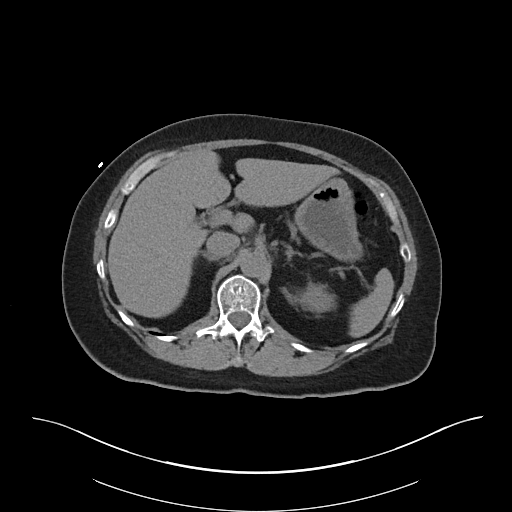
[im 76/87  soft-tissue]
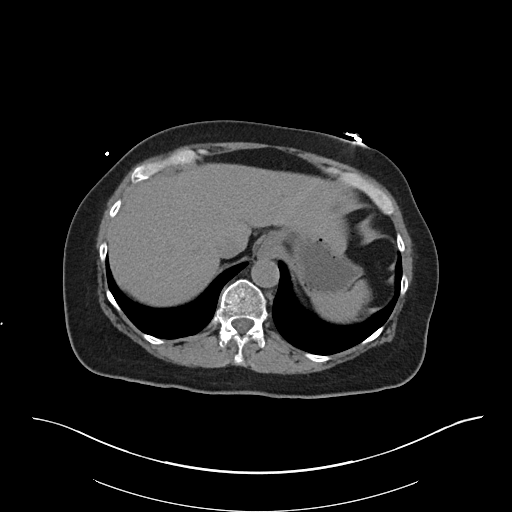
[im 83/87  soft-tissue]
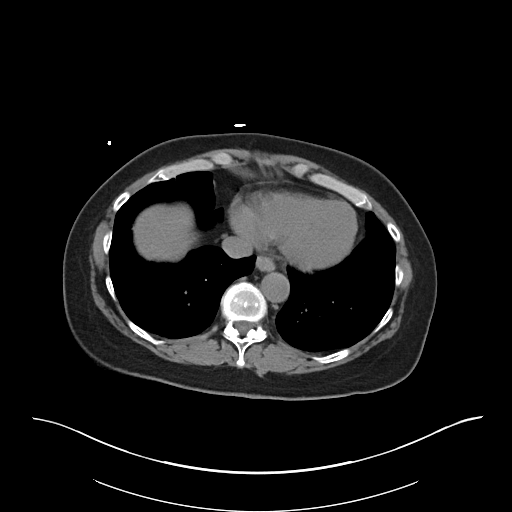

[Series 6: coronal soft tissue · coronal · 0.82mm/px · 3 of 101 slices shown]
[im 34/101  soft-tissue]
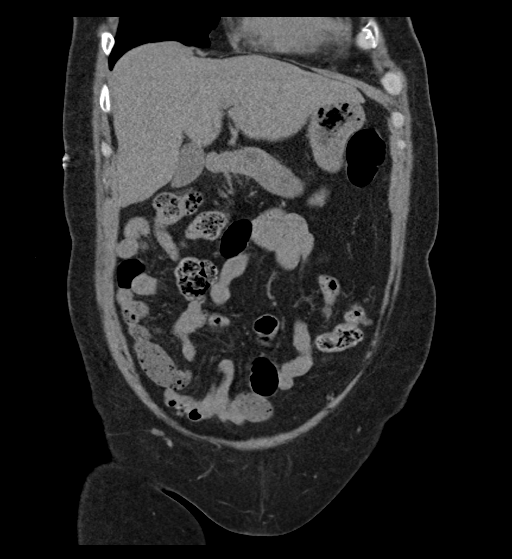
[im 45/101  soft-tissue]
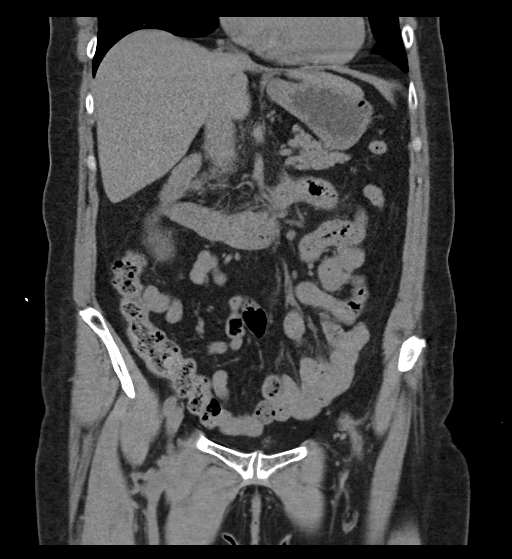
[im 56/101  soft-tissue]
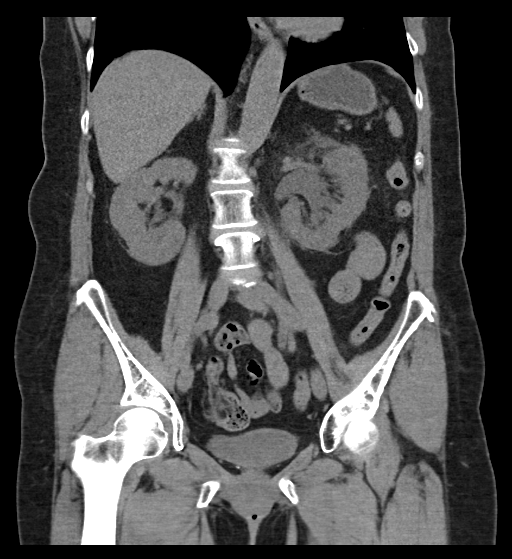

[16 of 46 positions shown; findings below may reference images not displayed]

FINDINGS: Lower chest: No acute abnormality.

Hepatobiliary: Limited without IV contrast. No large focal hepatic
abnormality or biliary obstruction pattern. Gallbladder
nondistended. Common bile duct nondilated.

Pancreas: Unremarkable. No pancreatic ductal dilatation or
surrounding inflammatory changes.

Spleen: Normal in size without focal abnormality.

Adrenals/Urinary Tract: Normal adrenal glands.

Right kidney and ureter demonstrate no acute obstruction,
hydronephrosis, or hydroureter.

Left kidney demonstrates no acute perinephric strandy
edema/inflammation associated moderate left hydroureteronephrosis.
This is secondary to a left distal ureteral calculus in the pelvis,
image 67/3 measuring 5 mm.

Urinary bladder collapsed.

Stomach/Bowel: Stomach is within normal limits. Appendix appears
normal. No evidence of bowel wall thickening, distention, or
inflammatory changes.

Vascular/Lymphatic: Limited without IV contrast. Minor aortic
bifurcation atherosclerosis. Negative for aneurysm. No
retroperitoneal hemorrhage or hematoma. No bulky adenopathy.

Reproductive: Uterus and adnexa normal in size. Uterus retroverted
in position. No free fluid or fluid collection.

Other: No abdominal wall hernia or abnormality. No abdominopelvic
ascites.

Musculoskeletal: Unilateral pars defects on the right at L5. Minor
associated disc space narrowing. No acute osseous finding.
IMPRESSION: 5 mm moderately obstructing left distal ureteral calculus in the
pelvis with associated left hydroureteronephrosis.

## 2024-03-02 ENCOUNTER — Encounter: Payer: Self-pay | Admitting: Podiatry

## 2024-03-02 ENCOUNTER — Ambulatory Visit: Admitting: Podiatry

## 2024-03-02 ENCOUNTER — Ambulatory Visit (INDEPENDENT_AMBULATORY_CARE_PROVIDER_SITE_OTHER)

## 2024-03-02 DIAGNOSIS — M2011 Hallux valgus (acquired), right foot: Secondary | ICD-10-CM

## 2024-03-02 DIAGNOSIS — M21611 Bunion of right foot: Secondary | ICD-10-CM

## 2024-03-03 ENCOUNTER — Telehealth: Payer: Self-pay | Admitting: Podiatry

## 2024-03-03 NOTE — Telephone Encounter (Signed)
 Pt called back and left message for me to call her that she would like 6/24 if still available.   I returned call and pt is scheduled for 03/14/24. She is calling pcp to verify if a medication is a blood thinner or not. She is on ozempic and is aware she has to be off of it for 1 wk prior to surgery.  Pharmacy correct in chart.

## 2024-03-03 NOTE — Telephone Encounter (Signed)
 Pt left message yesterday at 453pm stating she was to be scheduling surgery with Dr Celia Coles and was wanting to see if she could get it done before 7/10 as she is going to be flying out of town. She will be back in town after 7/16 so if we cannot do before 7/10 it would have to be after 7/16.  I returned call and left message for pt that I was looking to possibly schedule her for 6/24 if that would work for her. I asked her to give me a call back to discuss.

## 2024-03-03 NOTE — Progress Notes (Signed)
 Subjective:   Patient ID: Teresa Martin, female   DOB: 63 y.o.   MRN: [id number]   HPI Patient states she has had a painful bunion on her right foot for a long time and its gotten worse recently and is difficult to wear shoe gear and she has tried wider shoes she has tried soaks and cushioning of the area.  Also does have relative flatfeet concerned about bunion left with grandmother having had poor foot structure.  Patient does not smoke likes to be active and would like this corrected soon due to her vacation schedule   Review of Systems  All other systems reviewed and are negative.       Objective:  Physical Exam Vitals and nursing note reviewed.  Constitutional:      Appearance: She is well-developed.  Pulmonary:     Effort: Pulmonary effort is normal.   Musculoskeletal:        General: Normal range of motion.   Skin:    General: Skin is warm.   Neurological:     Mental Status: She is alert.     Neurovascular status intact muscle strength found to be adequate range of motion adequate does have diabetes under excellent control with A1c of 7 with patient found to have redness and pain of the first metatarsal head right over left with discomfort and fluid buildup around the first metatarsal head right.  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  I discussed distal osteotomy based on x-ray report and I went ahead I allowed her to read consent form line by line with ample opportunities for questions and discussed also alternative treatments.  At this point she is scheduled for outpatient surgery she understands  she will be in a boot surgical shoe for approximately 4 to 5 weeks total recovery.  Will take probably 4 to 6 months and I fitted her for air fracture walker properly fitted to her lower leg and I reviewed the procedure  X-rays indicate elevation of the intermetatarsal angle of approximately 15 degrees right with moderate deviation of the right big toe against the second toe

## 2024-03-06 ENCOUNTER — Telehealth: Payer: Self-pay | Admitting: Podiatry

## 2024-03-06 NOTE — Telephone Encounter (Signed)
 Called pts insurance [employer] state to get benefit information and check on if authorization is needed and was told that pt has a primary insurance and [place] state is her secondary insurance.  I called pt and notified her of what the insurance said and she is to call the [employer] and get it straight and let me know.

## 2024-03-09 ENCOUNTER — Telehealth: Payer: Self-pay | Admitting: Podiatry

## 2024-03-09 NOTE — Telephone Encounter (Signed)
 Pt called and left message stating she is wanted to cancel her surgery for 6/24 at the surgery center. She stated the surgical center is asking for 1700.00 up front and her insurance company told her that she could have it done at the hospital ([facility] or [facility]) and it would not be as expensive and they would work with her as far as payment goes and it would only be at 1300.00) she asked if Dr Celia Coles could do the surgery at one of the hospitals and I told her he does not do any surgeries at the hospitals. She would like to have one of the providers that could do it at the hospital do it. She would like a provider that is well experienced in the surgery.   I told her I would consult with Dr Celia Coles and get his recommendation that I could not give her a recommendation.   I have notified Adah Acron at the surgery center to cxl the surgery.

## 2024-03-09 NOTE — Telephone Encounter (Signed)
 She also asked if she would need to have another appt with the other provider and I told her usually yes because the new provider has to have the consent in his name.

## 2024-03-09 NOTE — Telephone Encounter (Signed)
 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

## 2024-03-13 NOTE — Telephone Encounter (Signed)
 You can send to Dr. Gershon. He should see her soon

## 2024-03-20 ENCOUNTER — Encounter: Admitting: Podiatry

## 2024-03-21 ENCOUNTER — Telehealth: Payer: Self-pay | Admitting: Podiatry

## 2024-03-21 ENCOUNTER — Ambulatory Visit: Admitting: Podiatry

## 2024-03-21 DIAGNOSIS — M2011 Hallux valgus (acquired), right foot: Secondary | ICD-10-CM

## 2024-03-21 DIAGNOSIS — M21611 Bunion of right foot: Secondary | ICD-10-CM

## 2024-03-21 NOTE — Patient Instructions (Signed)

## 2024-03-21 NOTE — Telephone Encounter (Signed)
 Left message for pt that the surgery is scheduled at [facility] long or at 130pm is start time and they should be calling pt to let her know her arrival time.   I also left message that she would need to stop her ozempic 1 wk prior to the surgery and to call if any questions.

## 2024-03-23 NOTE — Progress Notes (Signed)
 Subjective: Chief Complaint  Patient presents with   Consult    RM#11 Surgical consult bunion needs done at a hospital.   63 year old female presents the office today for surgical consultation for right foot bunion.  She presents to the Dr. Magdalen for this and an Massie edwards was discussed.  He was already scheduled the outpatient surgical center but due to cost needs to be done at the hospital she states that since she has been wearing open toed shoes has not been as tender but she states that if she were to go back into closed in shoe they would rub and get tender   Objective: AAO x3, NAD DP/PT pulses palpable bilaterally, CRT less than 3 seconds Bunion noted on the right foot.  Today there is not significant inflammation noted.  There is no pain or crepitation with MPJ range of motion.  No hypermobility of the first ray.  No other areas of discomfort. No pain with calf compression, swelling, warmth, erythema  Assessment: Right foot bunion deformity  Plan: -All treatment options discussed with the patient including all alternatives, risks, complications.  -Previous x-rays were reviewed with the patient.  We discussed both conservative as well as surgical treatment options including pros and cons of each.  After discussion she wants to proceed with surgery.  Discussed different options of surgery.  Will proceed with first metatarsal osteotomy with likely screw wire fixation. -The incision placement as well as the postoperative course was discussed with the patient. I discussed risks of the surgery which include, but not limited to, infection, bleeding, pain, swelling, need for further surgery, delayed or nonhealing, painful or ugly scar, numbness or sensation changes, over/under correction, recurrence, transfer lesions, further deformity, hardware failure, DVT/PE, loss of toe/foot. Patient understands these risks and wishes to proceed with surgery. The surgical consent was reviewed with  the patient all 3 pages were signed. No promises or guarantees were given to the outcome of the procedure. All questions were answered to the best of my ability. Before the surgery the patient was encouraged to call the office if there is any further questions. The surgery will be performed at the hospital on an outpatient basis. -Patient encouraged to call the office with any questions, concerns, change in symptoms.   Donnice JONELLE Fees DPM

## 2024-03-29 NOTE — Progress Notes (Signed)
 Sent message, via epic in basket, requesting orders in epic from [facility].

## 2024-03-30 ENCOUNTER — Encounter: Admitting: Podiatry

## 2024-04-03 ENCOUNTER — Telehealth: Payer: Self-pay | Admitting: Podiatry

## 2024-04-03 ENCOUNTER — Encounter: Admitting: Podiatry

## 2024-04-03 NOTE — Telephone Encounter (Signed)
 DOS: 04/12/2024  [place] BUNIONECTOMY- 71703  [employer] EFFECTIVE DATE: 09/22/2023  DEDUCTIBLE: $1500 REMAINING: $1209.79 OOP: $5900 REMAINING: $5336.10 COINSURANCE: 30%; 70% AFTER DEDUCTIBLE IS MET  REF # FOR BENEFIT INQUIRY: [id number] JADE  PER MICA A. WITH HULAN [place] IVANA SHARA REQUIRED FOR CPT CODES 71703 REF #: [id number] AT 3:34 EST 04/03/2024

## 2024-04-04 NOTE — Patient Instructions (Addendum)
 SURGICAL WAITING ROOM VISITATION Patients having surgery or a procedure may have no more than 2 support people in the waiting area - these visitors may rotate in the visitor waiting room.   If the patient needs to stay at the hospital during part of their recovery, the visitor guidelines for inpatient rooms apply.  PRE-OP VISITATION  Pre-op nurse will coordinate an appropriate time for 1 support person to accompany the patient in pre-op.  This support person may not rotate.  This visitor will be contacted when the time is appropriate for the visitor to come back in the pre-op area.  Please refer to the [facility] website for the visitor guidelines for Inpatients (after your surgery is over and you are in a regular room).  You are not required to quarantine at this time prior to your surgery. However, you must do this: Hand Hygiene often Do NOT share personal items Notify your provider if you are in close contact with someone who has COVID or you develop fever 100.4 or greater, new onset of sneezing, cough, sore throat, shortness of breath or body aches.  If you test positive for Covid or have been in contact with anyone that has tested positive in the last 10 days please notify you surgeon.    Your procedure is scheduled on:  Wednesday  April 12, 2024  Report to [facility] Main Entrance: [address] entrance where the [employer] is available.   Report to admitting at: 11:30    AM  Call this number if you have any questions or problems the morning of surgery [phone]  DO NOT EAT OR DRINK ANYTHING AFTER MIDNIGHT THE NIGHT PRIOR TO YOUR SURGERY / PROCEDURE.   FOLLOW  ANY ADDITIONAL PRE OP INSTRUCTIONS YOU RECEIVED FROM YOUR SURGEON'S OFFICE!!!   Oral Hygiene is also important to reduce your risk of infection.        Remember - BRUSH YOUR TEETH THE MORNING OF SURGERY WITH YOUR REGULAR TOOTHPASTE  Do NOT smoke after Midnight the night before surgery.  Ozempic- injects on  Thursday  Last dose will be on: 03-30-24  Metformin-  Day BEFORE surgery, take as usual.  DO NOT TAKE METFORMIN THE DAY OF YOUR SURGERY.  Doreen- Stop taking 72 hours before your surgery,  last dose will be taken on 04-08-24  STOP TAKING all Vitamins, Herbs and supplements 1 week before your surgery.   Take ONLY these medicines the morning of surgery with A SIP OF WATER: None   DO NOT TAKE Concerta or Lisinopril  / HCTZ the morning of your surgery.   You may not have any metal on your body including hair pins, jewelry, and body piercing  Do not wear make-up, lotions, powders, perfumes or deodorant  Do not wear nail polish including gel and S&S, artificial / acrylic nails, or any other type of covering on natural nails including finger and toenails. If you have artificial nails, gel coating, etc., that needs to be removed by a nail salon, Please have this removed prior to surgery. Not doing so may mean that your surgery could be cancelled or delayed if the Surgeon or anesthesia staff feels like they are unable to monitor you safely.   Do not shave 48 hours prior to surgery to avoid nicks in your skin which may contribute to postoperative infections.   Contacts, Hearing Aids, dentures or bridgework may not be worn into surgery. DENTURES WILL BE REMOVED PRIOR TO SURGERY PLEASE DO NOT APPLY Poly grip OR  ADHESIVES!!!  Patients discharged on the day of surgery will not be allowed to drive home.  Someone NEEDS to stay with you for the first 24 hours after anesthesia.  Do not bring your home medications to the hospital. The Pharmacy will dispense medications listed on your medication list to you during your admission in the Hospital.  Please read over the following fact sheets you were given: IF YOU HAVE QUESTIONS ABOUT YOUR PRE-OP INSTRUCTIONS, PLEASE CALL [phone].   [facility] - Preparing for Surgery Before surgery, you can play an important role.  Because skin is not sterile, your  skin needs to be as free of germs as possible.  You can reduce the number of germs on your skin by washing with CHG (chlorahexidine gluconate) soap before surgery.  CHG is an antiseptic cleaner which kills germs and bonds with the skin to continue killing germs even after washing. Please DO NOT use if you have an allergy to CHG or antibacterial soaps.  If your skin becomes reddened/irritated stop using the CHG and inform your nurse when you arrive at Short Stay. Do not shave (including legs and underarms) for at least 48 hours prior to the first CHG shower.  You may shave your face/neck.  Please follow these instructions carefully:  1.  Shower with CHG Soap the night before surgery and the  morning of surgery.  2.  If you choose to wash your hair, wash your hair first as usual with your normal  shampoo.  3.  After you shampoo, rinse your hair and body thoroughly to remove the shampoo.                             4.  Use CHG as you would any other liquid soap.  You can apply chg directly to the skin and wash.  Gently with a scrungie or clean washcloth.  5.  Apply the CHG Soap to your body ONLY FROM THE NECK DOWN.   Do not use on face/ open                           Wound or open sores. Avoid contact with eyes, ears mouth and genitals (private parts).                       Wash face,  Genitals (private parts) with your normal soap.             6.  Wash thoroughly, paying special attention to the area where your  surgery  will be performed.  7.  Thoroughly rinse your body with warm water from the neck down.  8.  DO NOT shower/wash with your normal soap after using and rinsing off the CHG Soap.            9.  Pat yourself dry with a clean towel.            10.  Wear clean pajamas.            11.  Place clean sheets on your bed the night of your first shower and do not  sleep with pets.  ON THE DAY OF SURGERY : Do not apply any lotions/deodorants the morning of surgery.  Please wear clean clothes to  the hospital/surgery center.    FAILURE TO FOLLOW THESE INSTRUCTIONS MAY RESULT IN THE CANCELLATION OF YOUR SURGERY  PATIENT  SIGNATURE_________________________________  NURSE SIGNATURE__________________________________  ________________________________________________________________________

## 2024-04-04 NOTE — Progress Notes (Signed)
 The patient was identified using 2 approved identifiers. All issues noted in this document were discussed and addressed, Teresa  Martin        voiced understanding and agreement with all preoperative instructions.   [email or web address]  The patient was emailed the surgery instructions per  her request.      The patient was instructed to call our Admitting Office [phone] or [phone]) to complete their Pre-surgical Interview.   Medication Recon. Was done 03-31-24  COVID Vaccine received:  []  No [x]  Yes Date of any COVID positive Test in last 90 days:  none  PCP - Almarie Scala, MD [phone] (Work) [phone] (Fax)  Cardiologist - Ananias End, MD)  Chest x-ray -  EKG -  [id number]   will repeat Stress Test -  ECHO -  Cardiac Cath -  CT Coronary Calcium  score: 2 on 03-08-2023  Epic  Pacemaker / ICD device [x]  No []  Yes   Spinal Cord Stimulator:[x]  No []  Yes       History of Sleep Apnea? [x]  No []  Yes   CPAP used?- [x]  No []  Yes    Does the patient monitor blood sugar?   []  [name]   []  No [x]  Yes  Patient has: []  NO Hx DM   []  Pre-DM   []  DM1  [x]   DM2 Last A1c was: 7.5  on   08-18-2021   Does patient have a [employer] or Dexcom? [x]  No []  Yes   Fasting Blood Sugar Ranges- 120-160 Checks Blood Sugar _1_ times a day  Ozempic- injects on Thursday  Last dose: 03-30-24 Metformin-  Hold DOS Farxiga- Hold x 72 hours,  last dose: 04-08-24  Blood Thinner / Instructions:  none Aspirin Instructions:  none  ERAS Protocol Ordered: [x]  No  []  Yes Patient is to be NPO after: 1130  Dental hx: []  Dentures:  [x]  [name]      []  Bridge or Partial:                   []  Loose or Damaged teeth:   Activity level: Able to walk up 2 flights of stairs without becoming significantly short of breath or having chest pain?  [x]  No   []    Yes   Anesthesia review: DM2, ADD, HTN  Patient denies any S&S of respiratory illness or Covid - no shortness of breath, fever, cough or chest pain at PAT  appointment.  Patient verbalized understanding and agreement to the Pre-Surgical Instructions that were given to them at this PAT appointment. Patient was also educated of the need to review these PAT instructions again prior to her surgery.I reviewed the appropriate phone numbers to call if they have any and questions or concerns.

## 2024-04-05 ENCOUNTER — Encounter (HOSPITAL_COMMUNITY): Payer: Self-pay

## 2024-04-05 ENCOUNTER — Encounter (HOSPITAL_COMMUNITY)
Admission: RE | Admit: 2024-04-05 | Discharge: 2024-04-05 | Disposition: A | Discharge status: Home / Self Care | Payer: Self-pay | Source: Ambulatory Visit | Attending: Podiatry | Admitting: Podiatry

## 2024-04-05 DIAGNOSIS — I10 Essential (primary) hypertension: Secondary | ICD-10-CM

## 2024-04-05 DIAGNOSIS — F909 Attention-deficit hyperactivity disorder, unspecified type: Secondary | ICD-10-CM

## 2024-04-05 DIAGNOSIS — Z01818 Encounter for other preprocedural examination: Secondary | ICD-10-CM

## 2024-04-05 DIAGNOSIS — E119 Type 2 diabetes mellitus without complications: Secondary | ICD-10-CM

## 2024-04-05 HISTORY — DX: Personal history of urinary calculi: Z87.442

## 2024-04-05 HISTORY — DX: Unspecified osteoarthritis, unspecified site: M19.90

## 2024-04-10 ENCOUNTER — Telehealth: Payer: Self-pay | Admitting: Podiatry

## 2024-04-10 ENCOUNTER — Encounter: Admitting: Podiatry

## 2024-04-10 NOTE — Telephone Encounter (Signed)
 Pt called and was scheduled for surgery on 7/23 and was out of town and is kind of feeling like she may have caught something. 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  She is possibly going to reschedule in December but is aware to not wait until beginning of December as it may get busy.  Spoke to [place] at hospital scheduling and she has canceled the surgery

## 2024-04-12 ENCOUNTER — Ambulatory Visit (HOSPITAL_COMMUNITY): Admission: RE | Admit: 2024-04-12 | Source: Home / Self Care | Admitting: Podiatry

## 2024-04-12 ENCOUNTER — Encounter (HOSPITAL_COMMUNITY): Admission: RE | Payer: Self-pay | Source: Home / Self Care

## 2024-04-12 SURGERY — BUNIONECTOMY
Anesthesia: Choice | Site: Toe | Laterality: Right

## 2024-04-17 ENCOUNTER — Encounter: Admitting: Podiatry

## 2024-04-27 ENCOUNTER — Encounter: Admitting: Podiatry

## 2024-05-11 ENCOUNTER — Encounter: Admitting: Podiatry

## 2024-07-25 ENCOUNTER — Other Ambulatory Visit: Payer: Self-pay | Admitting: Family Medicine

## 2024-07-25 DIAGNOSIS — R918 Other nonspecific abnormal finding of lung field: Secondary | ICD-10-CM

## 2024-07-25 DIAGNOSIS — Z77090 Contact with and (suspected) exposure to asbestos: Secondary | ICD-10-CM

## 2024-08-08 ENCOUNTER — Other Ambulatory Visit: Payer: Self-pay | Admitting: Nurse Practitioner

## 2024-08-08 DIAGNOSIS — R911 Solitary pulmonary nodule: Secondary | ICD-10-CM

## 2024-08-08 DIAGNOSIS — Z77090 Contact with and (suspected) exposure to asbestos: Secondary | ICD-10-CM

## 2024-09-15 ENCOUNTER — Other Ambulatory Visit

## 2024-09-18 ENCOUNTER — Encounter: Payer: Self-pay | Admitting: Nurse Practitioner

## 2024-09-20 ENCOUNTER — Other Ambulatory Visit

## 2024-10-27 NOTE — Therapy (Incomplete)
 " OUTPATIENT PHYSICAL THERAPY THORACOLUMBAR EVALUATION   Patient Name: Teresa Martin MRN: [id number] DOB:1960-12-11, 64 y.o., female Today's Date: 10/27/2024  END OF SESSION:   Past Medical History:  Diagnosis Date   ADD (attention deficit disorder)    Arthritis    DM2 (diabetes mellitus, type 2) (HCC)    followed by pcp   (09-01-2021 per pt check blood sugar daily in am fasting-- 120--180)   History of adenomatous polyp of colon    History of kidney stones    Hyperlipidemia    Hypertension    followed by pcp;   previously followed by cardiology-- dr end, lov in epic 10-22-2017   Left ureteral calculus    Thyroid  nodule    bilateral nodules,  bx left nodule 11-12-2015,  benign   Wears glasses    Past Surgical History:  Procedure Laterality Date   BLADDER SUSPENSION  04/11/2010   @[facility]  by dr johnnye;  Solyx sling   CYSTOSCOPY W/ URETERAL STENT PLACEMENT Left 08/18/2021   Procedure: CYSTOSCOPY WITH STENT REPLACEMENT;  Surgeon: Watt Rush, MD;  Location: [facility];  Service: Urology;  Laterality: Left;   CYSTOSCOPY/URETEROSCOPY/HOLMIUM LASER/STENT PLACEMENT Left 09/02/2021   Procedure: CYSTOSCOPY LEFT URETEROSCOPY//STENT REMOVAL;  Surgeon: Watt Rush, MD;  Location: [facility];  Service: Urology;  Laterality: Left;   KNEE SURGERY Right    1990s   Patient Active Problem List   Diagnosis Date Noted   Lower obstructive uropathy 08/18/2021   Diabetes mellitus type 2 in nonobese [facility]) 08/18/2021   ADD (attention deficit disorder) 08/18/2021   Hyperlipidemia 05/10/2013   Hypertension 04/27/2012   Cardiovascular risk factor 04/27/2012   Overweight 04/27/2012    PCP: Waylan Almarie SAUNDERS, MD  REFERRING PROVIDER: Waylan Almarie SAUNDERS, MD  REFERRING DIAG: M41.9 (ICD-10-CM) - Scoliosis M54.9 (ICD-10-CM) - Back pain  Rationale for Evaluation and Treatment: Rehabilitation  THERAPY DIAG:  No diagnosis found.  ONSET DATE: ***  SUBJECTIVE:                                                                                                                                                                                            SUBJECTIVE STATEMENT: ***  PERTINENT HISTORY:  Scoliosis, OA, DB type 2, HTN,   PAIN:  Are you having pain? Yes: NPRS scale: *** Pain location: *** Pain description: *** Aggravating factors: *** Relieving factors: ***  PRECAUTIONS: {Therapy precautions:24002}  RED FLAGS: {PT Red Flags:29287}   WEIGHT BEARING RESTRICTIONS: {Yes ***/No:24003}  FALLS:  Has patient fallen in last 6 months? {fallsyesno:27318}  LIVING ENVIRONMENT: Lives with: {OPRC lives with:25569::lives with their family} Lives in: {Lives  in:25570} Stairs: {opstairs:27293} Has following equipment at home: {Assistive devices:23999}  OCCUPATION: ***  PLOF: {PLOF:24004}  PATIENT GOALS: ***  NEXT MD VISIT: ***  OBJECTIVE:  Note: Objective measures were completed at Evaluation unless otherwise noted.  DIAGNOSTIC FINDINGS:  None  PATIENT SURVEYS:  {rehab surveys:24030}  COGNITION: Overall cognitive status: {cognition:24006}     SENSATION: {sensation:27233}  MUSCLE LENGTH: Hamstrings: Right *** deg; Left *** deg Debby test: Right *** deg; Left *** deg  POSTURE: {posture:25561}  PALPATION: ***  LUMBAR ROM:   AROM eval  Flexion   Extension   Right lateral flexion   Left lateral flexion   Right rotation   Left rotation    (Blank rows = not tested)  LOWER EXTREMITY ROM:     {AROM/PROM:27142}  Right eval Left eval  Hip flexion    Hip extension    Hip abduction    Hip adduction    Hip internal rotation    Hip external rotation    Knee flexion    Knee extension    Ankle dorsiflexion    Ankle plantarflexion    Ankle inversion    Ankle eversion     (Blank rows = not tested)  LOWER EXTREMITY MMT:    MMT Right eval Left eval  Hip flexion    Hip extension    Hip abduction    Hip adduction    Hip internal rotation     Hip external rotation    Knee flexion    Knee extension    Ankle dorsiflexion    Ankle plantarflexion    Ankle inversion    Ankle eversion     (Blank rows = not tested)  LUMBAR SPECIAL TESTS:  {lumbar special test:25242}  FUNCTIONAL TESTS:  {Functional tests:24029}  GAIT: Distance walked: *** Assistive device utilized: {Assistive devices:23999} Level of assistance: {Levels of assistance:24026} Comments: ***  TREATMENT DATE: ***                                                                                                                                 PATIENT EDUCATION:  Education details: *** Person educated: {Person educated:25204} Education method: {Education Method:25205} Education comprehension: {Education Comprehension:25206}  HOME EXERCISE PROGRAM: ***  ASSESSMENT:  CLINICAL IMPRESSION: Patient is a *** y.o. *** who was seen today for physical therapy evaluation and treatment for ***.   OBJECTIVE IMPAIRMENTS: {opptimpairments:25111}.   ACTIVITY LIMITATIONS: {activitylimitations:27494}  PARTICIPATION LIMITATIONS: {participationrestrictions:25113}  PERSONAL FACTORS: {Personal factors:25162} are also affecting patient's functional outcome.   REHAB POTENTIAL: {rehabpotential:25112}  CLINICAL DECISION MAKING: {clinical decision making:25114}  EVALUATION COMPLEXITY: {Evaluation complexity:25115}   GOALS: Goals reviewed with patient? {yes/no:20286}  SHORT TERM GOALS: Target date: ***  *** Baseline: Goal status: INITIAL  2.  *** Baseline:  Goal status: INITIAL  3.  *** Baseline:  Goal status: INITIAL  4.  *** Baseline:  Goal status: INITIAL  5.  *** Baseline:  Goal status: INITIAL  6.  ***  Baseline:  Goal status: INITIAL  LONG TERM GOALS: Target date: ***  *** Baseline:  Goal status: INITIAL  2.  *** Baseline:  Goal status: INITIAL  3.  *** Baseline:  Goal status: INITIAL  4.  *** Baseline:  Goal status:  INITIAL  5.  *** Baseline:  Goal status: INITIAL  6.  *** Baseline:  Goal status: INITIAL  PLAN:  PT FREQUENCY: {rehab frequency:25116}  PT DURATION: {rehab duration:25117}  PLANNED INTERVENTIONS: {rehab planned interventions:25118::97110-Therapeutic exercises,97530- Therapeutic [phone]- Neuromuscular re-education,97535- Self Rjmz,02859- Manual therapy,Patient/Family education}.  PLAN FOR NEXT SESSION: PIERRETTE Kristeen Sar, PT 10/27/2024, 9:03 AM  "

## 2024-10-28 ENCOUNTER — Ambulatory Visit: Admitting: Physical Therapy

## 2024-12-08 ENCOUNTER — Other Ambulatory Visit
